# Patient Record
Sex: Female | Born: 1950
Health system: Southern US, Community
[De-identification: ages and names within clinical notes are randomized; demographics above are authoritative.]

## PROBLEM LIST (undated history)

## (undated) DIAGNOSIS — E785 Hyperlipidemia, unspecified: Secondary | ICD-10-CM

## (undated) HISTORY — DX: Hyperlipidemia, unspecified: E78.5

---

## 2000-11-14 HISTORY — PX: ABDOMINAL HYSTERECTOMY: SHX81

## 2001-07-15 LAB — CONVERTED CEMR LAB

## 2008-07-31 ENCOUNTER — Ambulatory Visit: Payer: Self-pay | Admitting: Internal Medicine

## 2008-07-31 LAB — CONVERTED CEMR LAB
Basophils Absolute: 0.1 10*3/uL (ref 0.0–0.1)
CO2: 28 meq/L (ref 19–32)
Chloride: 102 meq/L (ref 96–112)
Cholesterol: 241 mg/dL (ref 0–200)
Direct LDL: 219.9 mg/dL
GFR calc non Af Amer: 135 mL/min
HDL: 45.1 mg/dL (ref >39.0)
Lymphocytes Relative: 19.8 % (ref 12.0–46.0)
MCHC: 35 g/dL (ref 30.0–36.0)
Neutrophils Relative %: 73.5 % (ref 43.0–77.0)
Platelets: 230 10*3/uL (ref 150–400)
RDW: 11.7 % (ref 11.5–14.6)
Sodium: 139 meq/L (ref 135–145)
TSH: 0.78 microintl units/mL (ref 0.35–5.50)
Triglycerides: 78 mg/dL (ref 0–149)
VLDL: 16 mg/dL (ref 0–40)

## 2008-08-01 ENCOUNTER — Encounter: Payer: Self-pay | Admitting: Internal Medicine

## 2008-08-25 ENCOUNTER — Ambulatory Visit: Payer: Self-pay | Admitting: Internal Medicine

## 2008-08-25 DIAGNOSIS — E785 Hyperlipidemia, unspecified: Secondary | ICD-10-CM | POA: Insufficient documentation

## 2008-08-25 HISTORY — DX: Hyperlipidemia, unspecified: E78.5

## 2008-09-23 ENCOUNTER — Ambulatory Visit: Payer: Self-pay | Admitting: Internal Medicine

## 2008-09-23 LAB — CONVERTED CEMR LAB
ALT: 20 units/L (ref 0–35)
AST: 18 units/L (ref 0–37)
Albumin: 4.1 g/dL (ref 3.5–5.2)
Alkaline Phosphatase: 75 units/L (ref 39–117)
HDL: 43 mg/dL (ref >39.0)
Total Bilirubin: 0.8 mg/dL (ref 0.3–1.2)
Triglycerides: 74 mg/dL (ref 0–149)

## 2008-09-24 ENCOUNTER — Encounter: Payer: Self-pay | Admitting: Internal Medicine

## 2008-10-03 ENCOUNTER — Ambulatory Visit: Payer: Self-pay | Admitting: Internal Medicine

## 2008-10-03 ENCOUNTER — Encounter: Payer: Self-pay | Admitting: Internal Medicine

## 2008-10-06 ENCOUNTER — Encounter: Payer: Self-pay | Admitting: Internal Medicine

## 2009-08-23 ENCOUNTER — Telehealth: Payer: Self-pay | Admitting: Family Medicine

## 2010-03-15 ENCOUNTER — Ambulatory Visit: Payer: Self-pay | Admitting: Internal Medicine

## 2010-03-15 LAB — CONVERTED CEMR LAB
AST: 20 units/L (ref 0–37)
Alkaline Phosphatase: 59 units/L (ref 39–117)
Basophils Absolute: 0 10*3/uL (ref 0.0–0.1)
Bilirubin Urine: NEGATIVE
Bilirubin, Direct: 0.1 mg/dL (ref 0.0–0.3)
Calcium: 9.2 mg/dL (ref 8.4–10.5)
Creatinine, Ser: 0.5 mg/dL (ref 0.4–1.2)
Eosinophils Absolute: 0 10*3/uL (ref 0.0–0.7)
GFR calc non Af Amer: 133.56 mL/min (ref >60)
Glucose, Bld: 92 mg/dL (ref 70–99)
HDL: 51.8 mg/dL (ref >39.00)
Hemoglobin: 14.4 g/dL (ref 12.0–15.0)
Ketones, ur: NEGATIVE mg/dL
LDL Cholesterol: 128 mg/dL — ABNORMAL HIGH (ref 0–99)
Lymphocytes Relative: 23.3 % (ref 12.0–46.0)
MCHC: 34.8 g/dL (ref 30.0–36.0)
Monocytes Relative: 5.6 % (ref 3.0–12.0)
Neutro Abs: 4.4 10*3/uL (ref 1.4–7.7)
Neutrophils Relative %: 69.8 % (ref 43.0–77.0)
Nitrite: NEGATIVE
Platelets: 202 10*3/uL (ref 150.0–400.0)
RDW: 12.4 % (ref 11.5–14.6)
Sodium: 141 meq/L (ref 135–145)
Total Bilirubin: 0.5 mg/dL (ref 0.3–1.2)
Total CHOL/HDL Ratio: 4
Triglycerides: 59 mg/dL (ref 0.0–149.0)
VLDL: 11.8 mg/dL (ref 0.0–40.0)
pH: 6.5 (ref 5.0–8.0)

## 2010-03-22 ENCOUNTER — Ambulatory Visit: Payer: Self-pay | Admitting: Internal Medicine

## 2010-09-06 ENCOUNTER — Ambulatory Visit: Payer: Self-pay | Admitting: *Deleted

## 2010-12-14 NOTE — Assessment & Plan Note (Signed)
Summary: FLU SHOT/EVM  Nurse Visit   Allergies: No Known Drug Allergies  Immunizations Administered:  Influenza Vaccine:    Vaccine Type: FLULAVAL    Site: left deltoid    Mfr: GlaxoSmithKline    Dose: 0.25 ml    Route: IM    Given by: Levonne Spiller EMT-P    Exp. Date: 05/14/2011    Lot #: [id number]    VIS given: 06/08/10 version given September 06, 2010.   Immunizations Administered:  Influenza Vaccine:    Vaccine Type: FLULAVAL    Site: left deltoid    Mfr: GlaxoSmithKline    Dose: 0.25 ml    Route: IM    Given by: Levonne Spiller EMT-P    Exp. Date: 05/14/2011    Lot #: [id number]    VIS given: 06/08/10 version given September 06, 2010.  Flu Vaccine Consent Questions:    Do you have a history of severe allergic reactions to this vaccine? no    Any prior history of allergic reactions to egg and/or gelatin? no    Do you have a sensitivity to the preservative Thimersol? no    Do you have a past history of Guillan-Barre Syndrome? no    Do you currently have an acute febrile illness? no    Have you ever had a severe reaction to latex? no    Vaccine information given and explained to patient? yes    Are you currently pregnant? no

## 2010-12-14 NOTE — Assessment & Plan Note (Signed)
Summary: PHYSICAL--STC   Vital Signs:  Patient profile:   60 year old female Height:      60 inches Weight:      125 pounds BMI:     24.50 O2 Sat:      96 % on Room air Temp:     98.3 degrees F oral Pulse rate:   74 / minute BP sitting:   136 / 92  (left arm) Cuff size:   regular  Vitals Entered By: Bill Salinas CMA (Mar 22, 2010 1:33 PM)  O2 Flow:  Room air CC: pt here for cpx, she states that her tetanus is due and she has never had a zostavax/ ab  Vision Screening:      Vision Comments: pt's last eye exam was 2 years ago with normal exam. PT unsure of doctor but has them done at [facility]  Vision Entered By: Bill Salinas CMA (Mar 22, 2010 1:36 PM)   Primary Care Provider:  Jacques Navy MD  CC:  pt here for cpx and she states that her tetanus is due and she has never had a zostavax/ ab.  History of Present Illness: Patient presents today for a general examination. She has no complaints at today's visit except "hot flashes" which have been present for some time.  Current Medications (verified): 1)  Lovastatin 20 Mg Tabs (Lovastatin) .Marland Kitchen.. 1 By Mouth Qpm For Choloesterol  Allergies (verified): No Known Drug Allergies  Past History:  Past Medical History: Last updated: 08-29-2008 UCD No medical problems except h/o elevated BP readings without diagnosis of HTN  Past Surgical History: Last updated: 08-29-2008 Hysterectomy '02 - secondary to fibroids    G2P2- NSVD  Family History: Last updated: 08-29-08 father- deceased 23-Apr-2066: CAD/MI mother- deceased @ 60: pneumonia, (?)PE Sister- breast cancer Neg - colon cancer, DM brother - prostate cancer, CVA  Social History: Last updated: 29-Aug-2008 HSG/GED Married Apr 23, 2066 1 son - April 23, 2066, 1 daugher - 24-Apr-1979; e3 grand-daughters work - Medical laboratory scientific officer; had worked in Designer, fashion/clothing. marriage in good health.  Review of Systems  The patient denies anorexia, fever, weight loss, weight gain, vision loss, decreased  hearing, hoarseness, chest pain, syncope, dyspnea on exertion, peripheral edema, prolonged cough, headaches, hemoptysis, abdominal pain, melena, hematochezia, severe indigestion/heartburn, hematuria, incontinence, genital sores, muscle weakness, suspicious skin lesions, difficulty walking, unusual weight change, abnormal bleeding, enlarged lymph nodes, and breast masses.    Physical Exam  General:  WNWD white female in no distress. Head:  Normocephalic and atraumatic without obvious abnormalities. No apparent alopecia or balding. Eyes:  No corneal or conjunctival inflammation noted. EOMI. Perrla. Funduscopic exam benign, without hemorrhages, exudates or papilledema. Vision grossly normal. Ears:  R ear normal and L ear normal.   Nose:  no external deformity and no external erythema.   Mouth:  Oral mucosa and oropharynx without lesions or exudates.  Teeth in good repair. Neck:  supple, full ROM, no thyromegaly, and no carotid bruits.   Chest Wall:  no deformities, no tenderness, and no mass.   Breasts:  No mass, nodules, thickening, tenderness, bulging, retraction, inflamation, nipple discharge or skin changes noted.   Lungs:  Normal respiratory effort, chest expands symmetrically. Lungs are clear to auscultation, no crackles or wheezes. Heart:  Normal rate and regular rhythm. S1 and S2 normal without gallop, murmur, click, rub or other extra sounds. Abdomen:  soft, non-tender, normal bowel sounds, no distention, no guarding, no rigidity, no inguinal hernia, and no hepatomegaly.   Genitalia:  deferred to hysterectomy Msk:  normal ROM, no joint tenderness, no joint swelling, no joint warmth, no joint instability, and no crepitation.   Pulses:  2+ radial and DP pulses Extremities:  No clubbing, cyanosis, edema, or deformity noted with normal full range of motion of all joints.   Neurologic:  No cranial nerve deficits noted. Station and gait are normal. Plantar reflexes are down-going bilaterally.  DTRs are symmetrical throughout. Sensory, motor and coordinative functions appear intact. Skin:  turgor normal, color normal, no suspicious lesions, no purpura, and no edema.  small vascular red lesions that blanche on both legs Cervical Nodes:  no anterior cervical adenopathy and no posterior cervical adenopathy.   Axillary Nodes:  no R axillary adenopathy and no L axillary adenopathy.   Psych:  Oriented X3, memory intact for recent and remote, normally interactive, good eye contact, and not anxious appearing.     Impression & Recommendations:  Problem # 1:  HYPERLIPIDEMIA, SEVERE (ICD-272.4) LDL 128 - at goal of 130 or less  Plan - continue present meds  Her updated medication list for this problem includes:    Lovastatin 20 Mg Tabs (Lovastatin) .Marland Kitchen... 1 by mouth qpm for choloesterol  Problem # 2:  Preventive Health Care (ICD-V70.0) Normal history and normal exam. Labs are excellent. Current with mammography. Colonoscopy '09. Patient is due for pneumonia vaccine and tetnus booster and shingles vaccine. Will address at next visit or at her convenience.  IN summary - a ver nice woman who is medically stable at this time.   Complete Medication List: 1)  Lovastatin 20 Mg Tabs (Lovastatin) .Marland Kitchen.. 1 by mouth qpm for choloesterol   Patient: Meredith Holden Note: All result statuses are Final unless otherwise noted.  Tests: (1) BMP (METABOL)   Sodium                    141 mEq/L                   135-145   Potassium                 4.5 mEq/L                   3.5-5.1   Chloride                  104 mEq/L                   96-112   Carbon Dioxide            32 mEq/L                    19-32   Glucose                   92 mg/dL                    04-54   BUN                       14 mg/dL                    0-98   Creatinine                0.5 mg/dL                   1.1-9.1   Calcium  9.2 mg/dL                   5.6-21.3   GFR                       133.56 mL/min                >60  Tests: (2) CBC Platelet w/Diff (CBCD)   White Cell Count          6.3 K/uL                    4.5-10.5   Red Cell Count            4.35 Mil/uL                 3.87-5.11   Hemoglobin                14.4 g/dL                   08.6-57.8   Hematocrit                41.4 %                      36.0-46.0   MCV                       95.2 fl                     78.0-100.0   MCHC                      34.8 g/dL                   46.9-62.9   RDW                       12.4 %                      11.5-14.6   Platelet Count            202.0 K/uL                  150.0-400.0   Neutrophil %              69.8 %                      43.0-77.0   Lymphocyte %              23.3 %                      12.0-46.0   Monocyte %                5.6 %                       3.0-12.0   Eosinophils%              0.7 %                       0.0-5.0   Basophils %               0.6 %  0.0-3.0   Neutrophill Absolute      4.4 K/uL                    1.4-7.7   Lymphocyte Absolute       1.5 K/uL                    0.7-4.0   Monocyte Absolute         0.4 K/uL                    0.1-1.0  Eosinophils, Absolute                             0.0 K/uL                    0.0-0.7   Basophils Absolute        0.0 K/uL                    0.0-0.1  Tests: (3) Hepatic/Liver Function Panel (HEPATIC)   Total Bilirubin           0.5 mg/dL                   4.7-8.2   Direct Bilirubin          0.1 mg/dL                   9.5-6.2   Alkaline Phosphatase      59 U/L                      39-117   AST                       20 U/L                      0-37   ALT                       18 U/L                      0-35   Total Protein             6.3 g/dL                    1.3-0.8   Albumin                   4.0 g/dL                    6.5-7.8  Tests: (4) TSH (TSH)   FastTSH                   1.11 uIU/mL                 0.35-5.50  Tests: (5) Lipid Panel (LIPID)   Cholesterol               192 mg/dL                    4-696     ATP III Classification            Desirable:  < 200 mg/dL  Borderline High:  200 - 239 mg/dL               High:  > = 240 mg/dL   Triglycerides             59.0 mg/dL                  1.6-109.6     Normal:  <150 mg/dL     Borderline High:  045 - 199 mg/dL   HDL                       40.98 mg/dL                 >11.91   VLDL Cholesterol          11.8 mg/dL                  4.7-82.9   LDL Cholesterol      [H]  562 mg/dL                   1-30  CHO/HDL Ratio:  CHD Risk                             4                    Men          Women     1/2 Average Risk     3.4          3.3     Average Risk          5.0          4.4     2X Average Risk          9.6          7.1     3X Average Risk          15.0          11.0                           Tests: (6) UDip w/Micro (URINE)   Color                     Yellow       RANGE:  Yellow;Lt. Yellow   Clarity                   CLEAR                       Clear   Specific Gravity          1.010                       1.000 - 1.030   Urine Ph                  6.5                         5.0-8.0   Protein                   NEGATIVE                    Negative   Urine Glucose  NEGATIVE                    Negative   Ketones                   NEGATIVE                    Negative   Urine Bilirubin           NEGATIVE                    Negative   Blood                     TRACE-INTACT                Negative   Urobilinogen              0.2                         0.0 - 1.0   Leukocyte Esterace        TRACE                       Negative   Nitrite                   NEGATIVE                    Negative   Urine WBC                 0-2/hpf                     0-2/hpf   Urine RBC                 0-2/hpf                     0-2/hpf   Urine Epith               Rare(0-4/hpf)               Rare(0-4/hpf)    Immunization History:  Influenza Immunization History:    Influenza:  historical (08/15/2009)

## 2011-05-28 ENCOUNTER — Other Ambulatory Visit: Payer: Self-pay | Admitting: Internal Medicine

## 2011-08-19 ENCOUNTER — Encounter: Payer: Self-pay | Admitting: Internal Medicine

## 2011-09-22 ENCOUNTER — Encounter: Payer: Self-pay | Admitting: Internal Medicine

## 2011-09-26 ENCOUNTER — Encounter: Payer: Self-pay | Admitting: Internal Medicine

## 2011-09-26 ENCOUNTER — Ambulatory Visit (INDEPENDENT_AMBULATORY_CARE_PROVIDER_SITE_OTHER): Payer: PRIVATE HEALTH INSURANCE | Admitting: Internal Medicine

## 2011-09-26 ENCOUNTER — Other Ambulatory Visit (INDEPENDENT_AMBULATORY_CARE_PROVIDER_SITE_OTHER): Payer: PRIVATE HEALTH INSURANCE

## 2011-09-26 VITALS — BP 132/82 | HR 70 | Temp 97.6°F | Wt 131.0 lb

## 2011-09-26 DIAGNOSIS — Z23 Encounter for immunization: Secondary | ICD-10-CM

## 2011-09-26 DIAGNOSIS — E785 Hyperlipidemia, unspecified: Secondary | ICD-10-CM

## 2011-09-26 DIAGNOSIS — Z136 Encounter for screening for cardiovascular disorders: Secondary | ICD-10-CM

## 2011-09-26 DIAGNOSIS — Z0000 Encounter for general adult medical examination without abnormal findings: Secondary | ICD-10-CM

## 2011-09-26 MED ORDER — TETANUS-DIPHTH-ACELL PERTUSSIS 5-2.5-18.5 LF-MCG/0.5 IM SUSP: 0.5000 mL | Freq: Once | INTRAMUSCULAR | Status: DC

## 2011-09-26 MED ORDER — LOVASTATIN 20 MG PO TABS: 20.0000 mg | ORAL_TABLET | Freq: Every day | ORAL | Status: DC

## 2011-09-26 NOTE — Assessment & Plan Note (Addendum)
Patient tolerating medication without side effects.   Plan- routine lab with recommendations to follow. Rx sent in.  Addendum - LDL 123 is better than goal of 130 or less. No changes recommended.

## 2011-09-26 NOTE — Progress Notes (Signed)
Subjective:    Patient ID: Meredith Holden, female    DOB: Oct 21, 1951, 60 y.o.   MRN: [id number]  HPI Meredith Holden presents today for an annul exam. In the interval no major illness, no surgery and no injury.  Past Medical History  Diagnosis Date  . HYPERLIPIDEMIA, SEVERE 08/25/2008   Past Surgical History  Procedure Date  . Abdominal hysterectomy 2002    secondary to fibroids   Family History  Problem Relation Age of Onset  . Coronary artery disease Father   . Heart disease Father     sudden death from MI  . Hypertension Mother   . Hypertension Sister   . Cancer Sister     uterine cancer; 20 years later breast cancer  . Kidney disease Sister   . Heart disease Brother     s/p SBE, bioprothetic valve Mitral?   History   Social History  . Marital Status: Married    Spouse Name: N/A    Number of Children: 2  . Years of Education: 12   Occupational History  . vet assistant    Social History Main Topics  . Smoking status: Never Smoker   . Smokeless tobacco: Never Used  . Alcohol Use: No  . Drug Use: No  . Sexually Active: Not Currently   Other Topics Concern  . Not on file   Social History Narrative   HSG/GED. Married "23. 1 son-"67, 1 daughter "50; 3 grand-daughters. Film/video editor; had work in [employer]. Marriage is good       Review of Systems Constitutional:  Negative for fever, chills, activity change and unexpected weight change.  HEENT:  Negative for hearing loss, ear pain, congestion, neck stiffness and postnasal drip. Negative for sore throat or swallowing problems. Negative for dental complaints.   Eyes: Negative for vision loss or change in visual acuity.  Respiratory: Negative for chest tightness and wheezing. Negative for DOE.   Cardiovascular: Negative for chest pain or palpitations. No decreased exercise tolerance Gastrointestinal: No change in bowel habit. No bloating or gas. No reflux or indigestion Genitourinary: Negative for urgency,  frequency, flank pain and difficulty urinating.  Musculoskeletal: Negative for myalgias, back pain, arthralgias and gait problem.  Neurological: Negative for dizziness, tremors, weakness and headaches.  Hematological: Negative for adenopathy.  Psychiatric/Behavioral: Negative for behavioral problems and dysphoric mood.       Objective:   Physical Exam Vitals reviewed - vitals stable. Gen'l: well nourished, well developed white woman in no distress HEENT - Mulat/AT, EACs/TMs normal, oropharynx with native dentition in good condition, no buccal or palatal lesions, posterior pharynx clear, mucous membranes moist. C&S clear, PERRLA, fundi - normal Neck - supple, no thyromegaly Nodes- negative submental, cervical, supraclavicular regions Chest - no deformity, no CVAT Lungs - cleat without rales, wheezes. No increased work of breathing Breast - skin normal, nipples w/o discharge, no fixed mass or lesions, no axillary adenopathy Cardiovascular - regular rate and rhythm, quiet precordium, no murmurs, rubs or gallops, 2+ radial, DP and PT pulses Abdomen - BS+ x 4, no HSM, no guarding or rebound or tenderness Pelvic - deferred  Rectal - deferred  Extremities - no clubbing, cyanosis, edema or deformity.  Neuro - A&O x 3, CN II-XII normal, motor strength normal and equal, DTRs 2+ and symmetrical biceps, radial, and patellar tendons. Cerebellar - no tremor, no rigidity, fluid movement and normal gait. Derm - Head, neck, back, abdomen and extremities without suspicious lesions  Lab Results  Component Value Date  WBC 6.3 03/15/2010   HGB 14.4 03/15/2010   HCT 41.4 03/15/2010   PLT 202.0 03/15/2010   GLUCOSE 78 09/26/2011   CHOL 194 09/26/2011   TRIG 60.0 09/26/2011   HDL 58.60 09/26/2011   LDLDIRECT 219.9 07/31/2008   LDLCALC 123* 09/26/2011   ALT 16 09/26/2011   ALT 16 09/26/2011   AST 19 09/26/2011   AST 19 09/26/2011   NA 141 09/26/2011   K 4.9 09/26/2011   CL 106 09/26/2011   CREATININE 0.5  09/26/2011   BUN 14 09/26/2011   CO2 29 09/26/2011   TSH 1.11 03/15/2010              Assessment & Plan:

## 2011-09-26 NOTE — Assessment & Plan Note (Signed)
Interval history is benign. Physical exam is normal. Labs- Lipid, liver and Bmet:    Current for colorectal cancer screening with last study Nov '09. Immunizations: Tdap and Flu today. Last mammogram Sept 17th , '12 - normal. 12 lead EKG w/o signs of ischemia or injury.  In summary - a delightful woman who is medically stable and doing well.

## 2011-09-27 LAB — COMPREHENSIVE METABOLIC PANEL
ALT: 16 U/L (ref 0–35)
AST: 19 U/L (ref 0–37)
Albumin: 4.1 g/dL (ref 3.5–5.2)
CO2: 29 mEq/L (ref 19–32)
Calcium: 9.2 mg/dL (ref 8.4–10.5)
Chloride: 106 mEq/L (ref 96–112)
Creatinine, Ser: 0.5 mg/dL (ref 0.4–1.2)
GFR: 121.59 mL/min (ref >60.00)
Potassium: 4.9 mEq/L (ref 3.5–5.1)
Sodium: 141 mEq/L (ref 135–145)
Total Protein: 6.9 g/dL (ref 6.0–8.3)

## 2011-09-27 LAB — LIPID PANEL
HDL: 58.6 mg/dL (ref >39.00)
LDL Cholesterol: 123 mg/dL — ABNORMAL HIGH (ref 0–99)
Total CHOL/HDL Ratio: 3
Triglycerides: 60 mg/dL (ref 0.0–149.0)

## 2011-09-27 LAB — HEPATIC FUNCTION PANEL
Albumin: 4.1 g/dL (ref 3.5–5.2)
Total Protein: 6.9 g/dL (ref 6.0–8.3)

## 2011-10-01 ENCOUNTER — Encounter: Payer: Self-pay | Admitting: Internal Medicine

## 2013-08-05 ENCOUNTER — Encounter: Payer: Self-pay | Admitting: Internal Medicine

## 2014-04-12 ENCOUNTER — Encounter: Payer: Self-pay | Admitting: Internal Medicine

## 2014-08-04 ENCOUNTER — Other Ambulatory Visit (INDEPENDENT_AMBULATORY_CARE_PROVIDER_SITE_OTHER): Payer: No Typology Code available for payment source

## 2014-08-04 ENCOUNTER — Ambulatory Visit (INDEPENDENT_AMBULATORY_CARE_PROVIDER_SITE_OTHER): Payer: No Typology Code available for payment source | Admitting: Internal Medicine

## 2014-08-04 ENCOUNTER — Encounter: Payer: Self-pay | Admitting: Internal Medicine

## 2014-08-04 VITALS — BP 122/82 | HR 75 | Temp 98.3°F | Resp 16 | Ht 59.0 in | Wt 121.0 lb

## 2014-08-04 DIAGNOSIS — B8809 Other acariasis: Secondary | ICD-10-CM | POA: Diagnosis not present

## 2014-08-04 DIAGNOSIS — Z23 Encounter for immunization: Secondary | ICD-10-CM | POA: Diagnosis not present

## 2014-08-04 DIAGNOSIS — B880 Other acariasis: Secondary | ICD-10-CM | POA: Diagnosis not present

## 2014-08-04 DIAGNOSIS — E785 Hyperlipidemia, unspecified: Secondary | ICD-10-CM | POA: Diagnosis not present

## 2014-08-04 DIAGNOSIS — Z0000 Encounter for general adult medical examination without abnormal findings: Secondary | ICD-10-CM | POA: Diagnosis not present

## 2014-08-04 LAB — BASIC METABOLIC PANEL
BUN: 14 mg/dL (ref 6–23)
CALCIUM: 9.7 mg/dL (ref 8.4–10.5)
CO2: 33 mEq/L — ABNORMAL HIGH (ref 19–32)
Chloride: 105 mEq/L (ref 96–112)
Creatinine, Ser: 0.6 mg/dL (ref 0.4–1.2)
GFR: 113.2 mL/min (ref >60.00)
GLUCOSE: 110 mg/dL — AB (ref 70–99)
Potassium: 4.9 mEq/L (ref 3.5–5.1)
Sodium: 141 mEq/L (ref 135–145)

## 2014-08-04 LAB — LIPID PANEL
Cholesterol: 198 mg/dL (ref 0–200)
HDL: 60.6 mg/dL (ref >39.00)
LDL Cholesterol: 124 mg/dL — ABNORMAL HIGH (ref 0–99)
NONHDL: 137.4
Total CHOL/HDL Ratio: 3
Triglycerides: 67 mg/dL (ref 0.0–149.0)
VLDL: 13.4 mg/dL (ref 0.0–40.0)

## 2014-08-04 MED ORDER — ZOSTER VACCINE LIVE 19400 UNT/0.65ML ~~LOC~~ SOLR: 0.6500 mL | Freq: Once | SUBCUTANEOUS | Status: DC

## 2014-08-04 NOTE — Patient Instructions (Signed)
We will give you a prescription for the shingles shot. Call your insurance company and ask them how much it will be as usually medicare does not pay for this shot very well.   An over the counter water based lubricant is safe and effective. If this does not work well for you then we can send in an estrogen cream for you that should help you to have more natural moisture.   We will check your blood work today and call you with the results.  Come back in about 1 year for a check up. Call us if you are feeling sick or have questions sooner.  Chigger Bites:  Many home remedies for chigger bites are based upon the incorrect belief that chiggers burrow into and remain in the skin. Nail polish, alcohol, and bleach have been applied to the bites to attempt to get rid of the chiggers by "suffocating" or killing the chiggers. But because the chiggers are not present in the skin, these methods are not effective. Home remedies to help relieve the itching associated with chigger bites may help some people. These can include Taking a cool shower or applying cool compresses  Sitting in a cool bath  Using bath products that contain colloidal oatmeal Using benadryl cream over the counter

## 2014-08-04 NOTE — Assessment & Plan Note (Addendum)
Advised usage of OTC water based lubricants for her vaginal dryness with intercourse. Given rx for shingles shot. Flu shot given at today's visit. She will return in 1 year. Reminded about mammogram. Check lipid panel and BMP. In good health.

## 2014-08-04 NOTE — Assessment & Plan Note (Signed)
Talked with her about proper coverage when in outdoors, cool baths, benadryl cream, no itching to the sites. Advised to wash all bedding if she continues to get bites without new outdoor exposure.

## 2014-08-04 NOTE — Progress Notes (Signed)
Pre visit review using our clinic review tool, if applicable. No additional management support is needed unless otherwise documented below in the visit note. 

## 2014-08-04 NOTE — Progress Notes (Signed)
   Subjective:    Patient ID: Meredith Holden, female    DOB: 04-30-1951, 63 y.o.   MRN: [id number]  HPI The patient is a 63 YO female who is coming in today to establish care. She has PMH of hyperlipidemia. She is a lifelong non-smoker. She does have a couple bites on her lower ankle and top of foot that she wants looked at. She does have a trailer which stays parked at a park in [place] by [place] and thinks this is where she got bit. She has not noticed a rash around them and they do not hurt. They do not itch much either. She also wants to know what she can do for lubrication with sex. Since her hysterectomy she has noticed she is more dry. She denies chest pains, SOB, abdominal pain, diarrhea, constipation. She denies balance problems or falls. She denies change in her vision or hearing. Her last mammogram was about 1-2 years ago and she will go when she gets on medicare in November with her birthday. 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   Review of Systems  Constitutional: Negative for activity change, appetite change and fatigue.  HENT: Negative.   Eyes: Negative.   Respiratory: Negative for cough, chest tightness, shortness of breath and wheezing.   Cardiovascular: Negative for chest pain, palpitations and leg swelling.  Gastrointestinal: Negative for abdominal pain, diarrhea, constipation and abdominal distention.  Genitourinary: Negative.   Musculoskeletal: Negative for arthralgias, gait problem and myalgias.  Skin: Positive for wound.  Neurological: Negative for dizziness, weakness, light-headedness and headaches.      Objective:   Physical Exam  Vitals reviewed. Constitutional: She appears well-developed and well-nourished. No distress.  HENT:  Head: Normocephalic and atraumatic.  Eyes: EOM are normal.  Neck: Normal range of motion.  Cardiovascular: Normal rate and regular rhythm.   No murmur heard. Pulmonary/Chest: Effort normal and breath sounds  normal. No respiratory distress. She has no wheezes. She has no rales.  Abdominal: Soft. Bowel sounds are normal. She exhibits no distension. There is no tenderness. There is no rebound.  Musculoskeletal: She exhibits no tenderness.  Neurological: Coordination normal.  Skin: Skin is warm and dry.  Several red bumps on her right ankle and dorsum of the foot. Appear to be chigger bites. No surrounding rash or erythema. No warmth or tenderness.   Filed Vitals:   08/04/14 0809  BP: 122/82  Pulse: 75  Temp: 98.3 F (36.8 C)  TempSrc: Oral  Resp: 16  Height:  (1.499 m)  Weight: 121 lb (54.885 kg)  SpO2: 98%      Assessment & Plan:  Flu shot given and rx for shingles shot.

## 2014-08-04 NOTE — Assessment & Plan Note (Signed)
Continue lovastatin, check lipid panel. 

## 2014-08-13 ENCOUNTER — Other Ambulatory Visit: Payer: Self-pay | Admitting: Geriatric Medicine

## 2014-08-13 MED ORDER — LOVASTATIN 20 MG PO TABS: 20.0000 mg | ORAL_TABLET | Freq: Every day | ORAL | Status: DC

## 2015-04-30 ENCOUNTER — Telehealth: Payer: Self-pay | Admitting: *Deleted

## 2015-04-30 NOTE — Telephone Encounter (Signed)
I called pt to see when her last mammogram was. Last one in [facility] was 08/01/11. No answer/Left mess for patient to call back.

## 2015-05-01 NOTE — Telephone Encounter (Signed)
Patient returned call and I referred her to [facility].

## 2015-09-07 DIAGNOSIS — H5213 Myopia, bilateral: Secondary | ICD-10-CM | POA: Diagnosis not present

## 2015-09-07 DIAGNOSIS — H25042 Posterior subcapsular polar age-related cataract, left eye: Secondary | ICD-10-CM | POA: Diagnosis not present

## 2015-09-07 DIAGNOSIS — H11423 Conjunctival edema, bilateral: Secondary | ICD-10-CM | POA: Diagnosis not present

## 2015-09-07 DIAGNOSIS — H18413 Arcus senilis, bilateral: Secondary | ICD-10-CM | POA: Diagnosis not present

## 2015-09-07 DIAGNOSIS — H2513 Age-related nuclear cataract, bilateral: Secondary | ICD-10-CM | POA: Diagnosis not present

## 2015-09-07 DIAGNOSIS — H353111 Nonexudative age-related macular degeneration, right eye, early dry stage: Secondary | ICD-10-CM | POA: Diagnosis not present

## 2015-09-07 DIAGNOSIS — H11153 Pinguecula, bilateral: Secondary | ICD-10-CM | POA: Diagnosis not present

## 2015-09-07 DIAGNOSIS — H524 Presbyopia: Secondary | ICD-10-CM | POA: Diagnosis not present

## 2015-09-07 DIAGNOSIS — H52223 Regular astigmatism, bilateral: Secondary | ICD-10-CM | POA: Diagnosis not present

## 2016-08-22 DIAGNOSIS — R69 Illness, unspecified: Secondary | ICD-10-CM | POA: Diagnosis not present

## 2017-06-04 DIAGNOSIS — J988 Other specified respiratory disorders: Secondary | ICD-10-CM | POA: Diagnosis not present

## 2017-06-05 ENCOUNTER — Encounter: Payer: Self-pay | Admitting: Nurse Practitioner

## 2017-06-05 ENCOUNTER — Ambulatory Visit (INDEPENDENT_AMBULATORY_CARE_PROVIDER_SITE_OTHER): Payer: Medicare HMO | Admitting: Nurse Practitioner

## 2017-06-05 ENCOUNTER — Other Ambulatory Visit (INDEPENDENT_AMBULATORY_CARE_PROVIDER_SITE_OTHER): Payer: Medicare HMO

## 2017-06-05 VITALS — BP 156/88 | HR 96 | Temp 98.6°F | Ht 59.0 in | Wt 129.0 lb

## 2017-06-05 DIAGNOSIS — E782 Mixed hyperlipidemia: Secondary | ICD-10-CM | POA: Diagnosis not present

## 2017-06-05 DIAGNOSIS — H1032 Unspecified acute conjunctivitis, left eye: Secondary | ICD-10-CM

## 2017-06-05 DIAGNOSIS — J0140 Acute pansinusitis, unspecified: Secondary | ICD-10-CM | POA: Diagnosis not present

## 2017-06-05 LAB — HEPATIC FUNCTION PANEL
ALT: 41 U/L — AB (ref 0–35)
AST: 30 U/L (ref 0–37)
Albumin: 3.8 g/dL (ref 3.5–5.2)
Alkaline Phosphatase: 139 U/L — ABNORMAL HIGH (ref 39–117)
BILIRUBIN DIRECT: 0.2 mg/dL (ref 0.0–0.3)
BILIRUBIN TOTAL: 0.6 mg/dL (ref 0.2–1.2)
Total Protein: 7.2 g/dL (ref 6.0–8.3)

## 2017-06-05 LAB — LIPID PANEL
CHOL/HDL RATIO: 4
Cholesterol: 201 mg/dL — ABNORMAL HIGH (ref 0–200)
HDL: 44.8 mg/dL (ref >39.00)
LDL CALC: 139 mg/dL — AB (ref 0–99)
NonHDL: 156.61
TRIGLYCERIDES: 90 mg/dL (ref 0.0–149.0)
VLDL: 18 mg/dL (ref 0.0–40.0)

## 2017-06-05 MED ORDER — METHYLPREDNISOLONE ACETATE 40 MG/ML IJ SUSP
40.0000 mg | Freq: Once | INTRAMUSCULAR | Status: AC
  Administered 2017-06-05: 40 mg via INTRAMUSCULAR

## 2017-06-05 MED ORDER — HYDROCODONE-HOMATROPINE 5-1.5 MG/5ML PO SYRP: 5.0000 mL | ORAL_SOLUTION | Freq: Every evening | ORAL | 0 refills | Status: DC | PRN

## 2017-06-05 MED ORDER — DM-GUAIFENESIN ER 30-600 MG PO TB12: 1.0000 | ORAL_TABLET | Freq: Two times a day (BID) | ORAL | 0 refills | Status: DC | PRN

## 2017-06-05 MED ORDER — SALINE SPRAY 0.65 % NA SOLN: 1.0000 | NASAL | 0 refills | Status: DC | PRN

## 2017-06-05 MED ORDER — AMOXICILLIN-POT CLAVULANATE 875-125 MG PO TABS: 1.0000 | ORAL_TABLET | Freq: Two times a day (BID) | ORAL | 0 refills | Status: DC

## 2017-06-05 NOTE — Progress Notes (Signed)
Subjective:  Patient ID: Meredith Holden, female    DOB: 10-30-1951  Age: 66 y.o. MRN: [id number]  CC: Cough (coughing up green mucus---going on for 1 wk/ went to urgent care Sunday--got cough med and nasal spray--not helping/ last ov 2015 med refill?)   Cough  This is a new problem. The current episode started 1 to 4 weeks ago. The problem has been gradually worsening. The problem occurs constantly. The cough is productive of purulent sputum. Associated symptoms include chills, headaches, myalgias, nasal congestion, postnasal drip, rhinorrhea and a sore throat. Pertinent negatives include no chest pain, ear congestion, ear pain, fever, shortness of breath, weight loss or wheezing. The symptoms are aggravated by lying down. She has tried OTC cough suppressant and prescription cough suppressant for the symptoms. The treatment provided no relief. Her past medical history is significant for bronchitis. There is no history of asthma.    Outpatient Medications Prior to Visit  Medication Sig Dispense Refill  . lovastatin (MEVACOR) 20 MG tablet Take 1 tablet (20 mg total) by mouth at bedtime. 90 tablet 3  . zoster vaccine live, PF, (ZOSTAVAX) 40981 UNT/0.65ML injection Inject 19,400 Units into the skin once. (Patient not taking: Reported on 06/05/2017) 1 each 0   No facility-administered medications prior to visit.     ROS See HPI  Objective:  BP (!) 156/88   Pulse 96   Temp 98.6 F (37 C)   Ht 4\' 11"  (1.499 m)   Wt 129 lb (58.5 kg)   SpO2 97%   BMI 26.05 kg/m   BP Readings from Last 3 Encounters:  06/05/17 (!) 156/88  08/04/14 122/82  09/26/11 132/82    Wt Readings from Last 3 Encounters:  06/05/17 129 lb (58.5 kg)  08/04/14 121 lb (54.9 kg)  09/26/11 131 lb (59.4 kg)    Physical Exam  Constitutional: She is oriented to person, place, and time.  HENT:  Right Ear: Tympanic membrane, external ear and ear canal normal.  Left Ear: Tympanic membrane, external ear and ear canal  normal.  Nose: Mucosal edema and rhinorrhea present. Right sinus exhibits maxillary sinus tenderness. Right sinus exhibits no frontal sinus tenderness. Left sinus exhibits maxillary sinus tenderness. Left sinus exhibits no frontal sinus tenderness.  Mouth/Throat: Uvula is midline. No trismus in the jaw. Posterior oropharyngeal erythema present. No oropharyngeal exudate.  Eyes: Pupils are equal, round, and reactive to light. EOM are normal. Right eye exhibits no chemosis, no discharge, no exudate and no hordeolum. No foreign body present in the right eye. Left eye exhibits chemosis, discharge and exudate. Left eye exhibits no hordeolum. No foreign body present in the left eye. Right conjunctiva is not injected. Right conjunctiva has no hemorrhage. Left conjunctiva is injected. Left conjunctiva has no hemorrhage.  Neck: Normal range of motion. Neck supple.  Cardiovascular: Normal rate and normal heart sounds.   Pulmonary/Chest: Effort normal and breath sounds normal.  Musculoskeletal: She exhibits no edema.  Lymphadenopathy:    She has cervical adenopathy.  Neurological: She is alert and oriented to person, place, and time.  Skin: Skin is warm and dry.  Vitals reviewed.   Lab Results  Component Value Date   WBC 6.3 03/15/2010   HGB 14.4 03/15/2010   HCT 41.4 03/15/2010   PLT 202.0 03/15/2010   GLUCOSE 110 (H) 08/04/2014   CHOL 201 (H) 06/05/2017   TRIG 90.0 06/05/2017   HDL 44.80 06/05/2017   LDLDIRECT 219.9 07/31/2008   LDLCALC 139 (H) 06/05/2017   ALT  41 (H) 06/05/2017   AST 30 06/05/2017   NA 141 08/04/2014   K 4.9 08/04/2014   CL 105 08/04/2014   CREATININE 0.6 08/04/2014   BUN 14 08/04/2014   CO2 33 (H) 08/04/2014   TSH 1.11 03/15/2010    No results found.  Assessment & Plan:   Meredith Holden was seen today for cough.  Diagnoses and all orders for this visit:  Mixed hyperlipidemia -     Lipid panel; Future -     Hepatic function panel; Future -     lovastatin (MEVACOR) 20 MG  tablet; Take 1 tablet (20 mg total) by mouth at bedtime.  Acute non-recurrent pansinusitis -     amoxicillin-clavulanate (AUGMENTIN) 875-125 MG tablet; Take 1 tablet by mouth 2 (two) times daily. -     dextromethorphan-guaiFENesin (MUCINEX DM) 30-600 MG 12hr tablet; Take 1 tablet by mouth 2 (two) times daily as needed for cough. -     sodium chloride (OCEAN) 0.65 % SOLN nasal spray; Place 1 spray into both nostrils as needed for congestion. -     HYDROcodone-homatropine (HYCODAN) 5-1.5 MG/5ML syrup; Take 5 mLs by mouth at bedtime as needed for cough. -     methylPREDNISolone acetate (DEPO-MEDROL) injection 40 mg; Inject 1 mL (40 mg total) into the muscle once.  Acute bacterial conjunctivitis of left eye -     amoxicillin-clavulanate (AUGMENTIN) 875-125 MG tablet; Take 1 tablet by mouth 2 (two) times daily. -     dextromethorphan-guaiFENesin (MUCINEX DM) 30-600 MG 12hr tablet; Take 1 tablet by mouth 2 (two) times daily as needed for cough. -     sodium chloride (OCEAN) 0.65 % SOLN nasal spray; Place 1 spray into both nostrils as needed for congestion. -     HYDROcodone-homatropine (HYCODAN) 5-1.5 MG/5ML syrup; Take 5 mLs by mouth at bedtime as needed for cough. -     methylPREDNISolone acetate (DEPO-MEDROL) injection 40 mg; Inject 1 mL (40 mg total) into the muscle once.   I am having Meredith Holden start on amoxicillin-clavulanate, dextromethorphan-guaiFENesin, sodium chloride, and HYDROcodone-homatropine. I am also having her maintain her zoster vaccine live (PF), ipratropium, and lovastatin. We administered methylPREDNISolone acetate.  Meds ordered this encounter  Medications  . ipratropium (ATROVENT) 0.03 % nasal spray  . amoxicillin-clavulanate (AUGMENTIN) 875-125 MG tablet    Sig: Take 1 tablet by mouth 2 (two) times daily.    Dispense:  14 tablet    Refill:  0    Order Specific Question:   Supervising Provider    Answer:   Tresa GarterPLOTNIKOV, ALEKSEI V [[id number]]  . dextromethorphan-guaiFENesin  (MUCINEX DM) 30-600 MG 12hr tablet    Sig: Take 1 tablet by mouth 2 (two) times daily as needed for cough.    Dispense:  14 tablet    Refill:  0    Order Specific Question:   Supervising Provider    Answer:   Tresa GarterPLOTNIKOV, ALEKSEI V [[id number]]  . sodium chloride (OCEAN) 0.65 % SOLN nasal spray    Sig: Place 1 spray into both nostrils as needed for congestion.    Dispense:  15 mL    Refill:  0    Order Specific Question:   Supervising Provider    Answer:   Tresa GarterPLOTNIKOV, ALEKSEI V [[id number]]  . HYDROcodone-homatropine (HYCODAN) 5-1.5 MG/5ML syrup    Sig: Take 5 mLs by mouth at bedtime as needed for cough.    Dispense:  120 mL    Refill:  0    Order Specific Question:  Supervising Provider    Answer:   Tresa Garter [[id number]]  . methylPREDNISolone acetate (DEPO-MEDROL) injection 40 mg  . lovastatin (MEVACOR) 20 MG tablet    Sig: Take 1 tablet (20 mg total) by mouth at bedtime.    Dispense:  90 tablet    Refill:  1    Order Specific Question:   Supervising Provider    Answer:   Tresa Garter [[id number]]    Follow-up: Return in about 6 months (around 12/06/2017) for hyperlipidemia with Dr. Okey Dupre.  Alysia Penna, NP

## 2017-06-05 NOTE — Patient Instructions (Addendum)
Go to basement for blood draw. Will like to review lab results prior to sending medication refill.  URI Instructions: Flonase and Afrin use: apply 1spray of afrin in each nare, wait , then apply 2sprays of flonase in each nare. Use both nasal spray consecutively x 3days, then flonase only for at least 14days.  Encourage adequate oral hydration.  Use over-the-counter  "cold" medicines  such as "Tylenol cold" , "Advil cold",  "Mucinex" or" Mucinex D"  for cough and congestion.  Avoid decongestants if you have high blood pressure. Use" Delsym" or" Robitussin" cough syrup varietis for cough.  You can use plain "Tylenol" or "Advi"l for fever, chills and achyness.

## 2017-06-06 MED ORDER — LOVASTATIN 20 MG PO TABS: 20.0000 mg | ORAL_TABLET | Freq: Every day | ORAL | 1 refills | Status: DC

## 2017-08-14 DIAGNOSIS — R69 Illness, unspecified: Secondary | ICD-10-CM | POA: Diagnosis not present

## 2017-11-27 ENCOUNTER — Ambulatory Visit: Payer: Medicare HMO | Admitting: Internal Medicine

## 2017-11-28 ENCOUNTER — Ambulatory Visit: Payer: Medicare HMO | Admitting: Internal Medicine

## 2017-12-04 ENCOUNTER — Encounter: Payer: Self-pay | Admitting: Internal Medicine

## 2017-12-04 ENCOUNTER — Ambulatory Visit (INDEPENDENT_AMBULATORY_CARE_PROVIDER_SITE_OTHER): Payer: Medicare HMO | Admitting: Internal Medicine

## 2017-12-04 VITALS — BP 122/74 | HR 77 | Temp 98.1°F | Ht 59.0 in | Wt 127.1 lb

## 2017-12-04 DIAGNOSIS — Z23 Encounter for immunization: Secondary | ICD-10-CM | POA: Diagnosis not present

## 2017-12-04 DIAGNOSIS — E2839 Other primary ovarian failure: Secondary | ICD-10-CM

## 2017-12-04 DIAGNOSIS — Z0000 Encounter for general adult medical examination without abnormal findings: Secondary | ICD-10-CM | POA: Diagnosis not present

## 2017-12-04 DIAGNOSIS — E782 Mixed hyperlipidemia: Secondary | ICD-10-CM

## 2017-12-04 NOTE — Patient Instructions (Signed)
We will order the mammogram and bone density test.   The colonoscopy is due in the fall and we will have the GI office call you when it is time.   Ask the pharmacy about the shingrix vaccine to prevent shingles.   Health Maintenance, Female Adopting a healthy lifestyle and getting preventive care can go a long way to promote health and wellness. Talk with your health care provider about what schedule of regular examinations is right for you. This is a good chance for you to check in with your provider about disease prevention and staying healthy. In between checkups, there are plenty of things you can do on your own. Experts have done a lot of research about which lifestyle changes and preventive measures are most likely to keep you healthy. Ask your health care provider for more information. Weight and diet Eat a healthy diet  Be sure to include plenty of vegetables, fruits, low-fat dairy products, and lean protein.  Do not eat a lot of foods high in solid fats, added sugars, or salt.  Get regular exercise. This is one of the most important things you can do for your health. ? Most adults should exercise for at least 150 minutes each week. The exercise should increase your heart rate and make you sweat (moderate-intensity exercise). ? Most adults should also do strengthening exercises at least twice a week. This is in addition to the moderate-intensity exercise.  Maintain a healthy weight  Body mass index (BMI) is a measurement that can be used to identify possible weight problems. It estimates body fat based on height and weight. Your health care provider can help determine your BMI and help you achieve or maintain a healthy weight.  For females 64 years of age and older: ? A BMI below 18.5 is considered underweight. ? A BMI of 18.5 to 24.9 is normal. ? A BMI of 25 to 29.9 is considered overweight. ? A BMI of 30 and above is considered obese.  Watch levels of cholesterol and blood  lipids  You should start having your blood tested for lipids and cholesterol at 69 years of age, then have this test every 5 years.  You may need to have your cholesterol levels checked more often if: ? Your lipid or cholesterol levels are high. ? You are older than 69 years of age. ? You are at high risk for heart disease.  Cancer screening Lung Cancer  Lung cancer screening is recommended for adults 34-17 years old who are at high risk for lung cancer because of a history of smoking.  A yearly low-dose CT scan of the lungs is recommended for people who: ? Currently smoke. ? Have quit within the past 15 years. ? Have at least a 30-pack-year history of smoking. A pack year is smoking an average of one pack of cigarettes a day for 1 year.  Yearly screening should continue until it has been 15 years since you quit.  Yearly screening should stop if you develop a health problem that would prevent you from having lung cancer treatment.  Breast Cancer  Practice breast self-awareness. This means understanding how your breasts normally appear and feel.  It also means doing regular breast self-exams. Let your health care provider know about any changes, no matter how small.  If you are in your 20s or 30s, you should have a clinical breast exam (CBE) by a health care provider every 1-3 years as part of a regular health exam.  If  you are 40 or older, have a CBE every year. Also consider having a breast X-ray (mammogram) every year.  If you have a family history of breast cancer, talk to your health care provider about genetic screening.  If you are at high risk for breast cancer, talk to your health care provider about having an MRI and a mammogram every year.  Breast cancer gene (BRCA) assessment is recommended for women who have family members with BRCA-related cancers. BRCA-related cancers include: ? Breast. ? Ovarian. ? Tubal. ? Peritoneal cancers.  Results of the assessment will  determine the need for genetic counseling and BRCA1 and BRCA2 testing.  Cervical Cancer Your health care provider may recommend that you be screened regularly for cancer of the pelvic organs (ovaries, uterus, and vagina). This screening involves a pelvic examination, including checking for microscopic changes to the surface of your cervix (Pap test). You may be encouraged to have this screening done every 3 years, beginning at age 55.  For women ages 34-65, health care providers may recommend pelvic exams and Pap testing every 3 years, or they may recommend the Pap and pelvic exam, combined with testing for human papilloma virus (HPV), every 5 years. Some types of HPV increase your risk of cervical cancer. Testing for HPV may also be done on women of any age with unclear Pap test results.  Other health care providers may not recommend any screening for nonpregnant women who are considered low risk for pelvic cancer and who do not have symptoms. Ask your health care provider if a screening pelvic exam is right for you.  If you have had past treatment for cervical cancer or a condition that could lead to cancer, you need Pap tests and screening for cancer for at least 20 years after your treatment. If Pap tests have been discontinued, your risk factors (such as having a new sexual partner) need to be reassessed to determine if screening should resume. Some women have medical problems that increase the chance of getting cervical cancer. In these cases, your health care provider may recommend more frequent screening and Pap tests.  Colorectal Cancer  This type of cancer can be detected and often prevented.  Routine colorectal cancer screening usually begins at 69 years of age and continues through 69 years of age.  Your health care provider may recommend screening at an earlier age if you have risk factors for colon cancer.  Your health care provider may also recommend using home test kits to check  for hidden blood in the stool.  A small camera at the end of a tube can be used to examine your colon directly (sigmoidoscopy or colonoscopy). This is done to check for the earliest forms of colorectal cancer.  Routine screening usually begins at age 58.  Direct examination of the colon should be repeated every 5-10 years through 69 years of age. However, you may need to be screened more often if early forms of precancerous polyps or small growths are found.  Skin Cancer  Check your skin from head to toe regularly.  Tell your health care provider about any new moles or changes in moles, especially if there is a change in a mole's shape or color.  Also tell your health care provider if you have a mole that is larger than the size of a pencil eraser.  Always use sunscreen. Apply sunscreen liberally and repeatedly throughout the day.  Protect yourself by wearing long sleeves, pants, a wide-brimmed hat, and sunglasses  whenever you are outside.  Heart disease, diabetes, and high blood pressure  High blood pressure causes heart disease and increases the risk of stroke. High blood pressure is more likely to develop in: ? People who have blood pressure in the high end of the normal range (130-139/85-89 mm Hg). ? People who are overweight or obese. ? People who are African American.  If you are 80-64 years of age, have your blood pressure checked every 3-5 years. If you are 52 years of age or older, have your blood pressure checked every year. You should have your blood pressure measured twice-once when you are at a hospital or clinic, and once when you are not at a hospital or clinic. Record the average of the two measurements. To check your blood pressure when you are not at a hospital or clinic, you can use: ? An automated blood pressure machine at a pharmacy. ? A home blood pressure monitor.  If you are between 58 years and 58 years old, ask your health care provider if you should take  aspirin to prevent strokes.  Have regular diabetes screenings. This involves taking a blood sample to check your fasting blood sugar level. ? If you are at a normal weight and have a low risk for diabetes, have this test once every three years after 69 years of age. ? If you are overweight and have a high risk for diabetes, consider being tested at a younger age or more often. Preventing infection Hepatitis B  If you have a higher risk for hepatitis B, you should be screened for this virus. You are considered at high risk for hepatitis B if: ? You were born in a country where hepatitis B is common. Ask your health care provider which countries are considered high risk. ? Your parents were born in a high-risk country, and you have not been immunized against hepatitis B (hepatitis B vaccine). ? You have HIV or AIDS. ? You use needles to inject street drugs. ? You live with someone who has hepatitis B. ? You have had sex with someone who has hepatitis B. ? You get hemodialysis treatment. ? You take certain medicines for conditions, including cancer, organ transplantation, and autoimmune conditions.  Hepatitis C  Blood testing is recommended for: ? Everyone born from [birth year] through 1967. ? Anyone with known risk factors for hepatitis C.  Sexually transmitted infections (STIs)  You should be screened for sexually transmitted infections (STIs) including gonorrhea and chlamydia if: ? You are sexually active and are younger than 69 years of age. ? You are older than 69 years of age and your health care provider tells you that you are at risk for this type of infection. ? Your sexual activity has changed since you were last screened and you are at an increased risk for chlamydia or gonorrhea. Ask your health care provider if you are at risk.  If you do not have HIV, but are at risk, it may be recommended that you take a prescription medicine daily to prevent HIV infection. This is called  pre-exposure prophylaxis (PrEP). You are considered at risk if: ? You are sexually active and do not regularly use condoms or know the HIV status of your partner(s). ? You take drugs by injection. ? You are sexually active with a partner who has HIV.  Talk with your health care provider about whether you are at high risk of being infected with HIV. If you choose to begin PrEP, you should  first be tested for HIV. You should then be tested every 3 months for as long as you are taking PrEP. Pregnancy  If you are premenopausal and you may become pregnant, ask your health care provider about preconception counseling.  If you may become pregnant, take 400 to 800 micrograms (mcg) of folic acid every day.  If you want to prevent pregnancy, talk to your health care provider about birth control (contraception). Osteoporosis and menopause  Osteoporosis is a disease in which the bones lose minerals and strength with aging. This can result in serious bone fractures. Your risk for osteoporosis can be identified using a bone density scan.  If you are 64 years of age or older, or if you are at risk for osteoporosis and fractures, ask your health care provider if you should be screened.  Ask your health care provider whether you should take a calcium or vitamin D supplement to lower your risk for osteoporosis.  Menopause may have certain physical symptoms and risks.  Hormone replacement therapy may reduce some of these symptoms and risks. Talk to your health care provider about whether hormone replacement therapy is right for you. Follow these instructions at home:  Schedule regular health, dental, and eye exams.  Stay current with your immunizations.  Do not use any tobacco products including cigarettes, chewing tobacco, or electronic cigarettes.  If you are pregnant, do not drink alcohol.  If you are breastfeeding, limit how much and how often you drink alcohol.  Limit alcohol intake to no more  than 1 drink per day for nonpregnant women. One drink equals 12 ounces of beer, 5 ounces of wine, or 1 ounces of hard liquor.  Do not use street drugs.  Do not share needles.  Ask your health care provider for help if you need support or information about quitting drugs.  Tell your health care provider if you often feel depressed.  Tell your health care provider if you have ever been abused or do not feel safe at home. This information is not intended to replace advice given to you by your health care provider. Make sure you discuss any questions you have with your health care provider. Document Released: 05/16/2011 Document Revised: 04/07/2016 Document Reviewed: 08/04/2015 Elsevier Interactive Patient Education  [employer].

## 2017-12-04 NOTE — Assessment & Plan Note (Signed)
Recent lipid panel at goal, continue lovastatin daily. LDL goal <130.

## 2017-12-04 NOTE — Assessment & Plan Note (Signed)
Pneumonia  23 given at visit to start pneumonia series. Flu and tetanus up to date. Mammogram and bone density ordered. Labs up to date. Aged out of pap smear. Needs colonoscopy later this year and GI referral placed. Counseled about sun safety and mole surveillance. Given 10 year screening recommendations.

## 2017-12-04 NOTE — Progress Notes (Signed)
   Subjective:    Patient ID: Meredith Holden, female    DOB: [birth year], 67 y.o.   MRN: [id number]  HPI Here for medicare wellness and physical, no new complaints. Please see A/P for status and treatment of chronic medical problems.   Diet: heart healthy Physical activity: sedentary Depression/mood screen: negative Hearing: intact to whispered voice Visual acuity: grossly normal, performs annual eye exam  ADLs: capable Fall risk: none Home safety: good Cognitive evaluation: intact to orientation, naming, recall and repetition EOL planning: adv directives discussed  I have personally reviewed and have noted 1. The patient's medical and social history - reviewed today no changes 2. Their use of alcohol, tobacco or illicit drugs 3. Their current medications and supplements 4. The patient's functional ability including ADL's, fall risks, home safety risks and hearing or visual impairment. 5. Diet and physical activities 6. Evidence for depression or mood disorders 7. Care team reviewed and updated (available in snapshot)  Review of Systems  Constitutional: Negative.   HENT: Negative.   Eyes: Negative.   Respiratory: Negative for cough, chest tightness and shortness of breath.   Cardiovascular: Negative for chest pain, palpitations and leg swelling.  Gastrointestinal: Negative for abdominal distention, abdominal pain, constipation, diarrhea, nausea and vomiting.  Musculoskeletal: Negative.   Skin: Negative.   Neurological: Negative.   Psychiatric/Behavioral: Negative.       Objective:   Physical Exam  Constitutional: She is oriented to person, place, and time. She appears well-developed and well-nourished.  HENT:  Head: Normocephalic and atraumatic.  Eyes: EOM are normal.  Neck: Normal range of motion.  Cardiovascular: Normal rate and regular rhythm.  Pulmonary/Chest: Effort normal and breath sounds normal. No respiratory distress. She has no wheezes. She has no rales.    Abdominal: Soft. Bowel sounds are normal. She exhibits no distension. There is no tenderness. There is no rebound.  Musculoskeletal: She exhibits no edema.  Neurological: She is alert and oriented to person, place, and time. Coordination normal.  Skin: Skin is warm and dry.  Psychiatric: She has a normal mood and affect.   Vitals:   12/04/17 0956  BP: 122/74  Pulse: 77  Temp: 98.1 F (36.7 C)  TempSrc: Oral  SpO2: 96%  Weight: 127 lb 1.3 oz (57.6 kg)  Height: 4\' 11"  (1.499 m)      Assessment & Plan:  Prevnar 23 given at visit

## 2017-12-06 ENCOUNTER — Other Ambulatory Visit: Payer: Self-pay

## 2017-12-06 DIAGNOSIS — E782 Mixed hyperlipidemia: Secondary | ICD-10-CM

## 2017-12-06 MED ORDER — LOVASTATIN 20 MG PO TABS: 20.0000 mg | ORAL_TABLET | Freq: Every day | ORAL | 1 refills | Status: DC

## 2018-06-04 ENCOUNTER — Other Ambulatory Visit: Payer: Self-pay | Admitting: Internal Medicine

## 2018-06-04 DIAGNOSIS — E782 Mixed hyperlipidemia: Secondary | ICD-10-CM

## 2018-08-17 DIAGNOSIS — R69 Illness, unspecified: Secondary | ICD-10-CM | POA: Diagnosis not present

## 2018-10-19 DIAGNOSIS — H11153 Pinguecula, bilateral: Secondary | ICD-10-CM | POA: Diagnosis not present

## 2018-10-19 DIAGNOSIS — H52223 Regular astigmatism, bilateral: Secondary | ICD-10-CM | POA: Diagnosis not present

## 2018-10-19 DIAGNOSIS — H25013 Cortical age-related cataract, bilateral: Secondary | ICD-10-CM | POA: Diagnosis not present

## 2018-10-19 DIAGNOSIS — H18413 Arcus senilis, bilateral: Secondary | ICD-10-CM | POA: Diagnosis not present

## 2018-10-19 DIAGNOSIS — H5213 Myopia, bilateral: Secondary | ICD-10-CM | POA: Diagnosis not present

## 2018-10-19 DIAGNOSIS — H1045 Other chronic allergic conjunctivitis: Secondary | ICD-10-CM | POA: Diagnosis not present

## 2018-10-19 DIAGNOSIS — H35371 Puckering of macula, right eye: Secondary | ICD-10-CM | POA: Diagnosis not present

## 2018-10-19 DIAGNOSIS — H524 Presbyopia: Secondary | ICD-10-CM | POA: Diagnosis not present

## 2018-10-19 DIAGNOSIS — H353111 Nonexudative age-related macular degeneration, right eye, early dry stage: Secondary | ICD-10-CM | POA: Diagnosis not present

## 2018-10-19 DIAGNOSIS — H2513 Age-related nuclear cataract, bilateral: Secondary | ICD-10-CM | POA: Diagnosis not present

## 2018-11-27 ENCOUNTER — Other Ambulatory Visit: Payer: Self-pay | Admitting: Internal Medicine

## 2018-11-27 DIAGNOSIS — E782 Mixed hyperlipidemia: Secondary | ICD-10-CM

## 2019-02-19 ENCOUNTER — Other Ambulatory Visit: Payer: Self-pay | Admitting: Internal Medicine

## 2019-02-19 DIAGNOSIS — E782 Mixed hyperlipidemia: Secondary | ICD-10-CM

## 2019-07-10 DIAGNOSIS — R69 Illness, unspecified: Secondary | ICD-10-CM | POA: Diagnosis not present

## 2019-08-02 DIAGNOSIS — R69 Illness, unspecified: Secondary | ICD-10-CM | POA: Diagnosis not present

## 2020-02-06 DIAGNOSIS — H2513 Age-related nuclear cataract, bilateral: Secondary | ICD-10-CM | POA: Diagnosis not present

## 2020-02-06 DIAGNOSIS — H04123 Dry eye syndrome of bilateral lacrimal glands: Secondary | ICD-10-CM | POA: Diagnosis not present

## 2020-02-06 DIAGNOSIS — Z0100 Encounter for examination of eyes and vision without abnormal findings: Secondary | ICD-10-CM | POA: Diagnosis not present

## 2020-02-06 DIAGNOSIS — H353131 Nonexudative age-related macular degeneration, bilateral, early dry stage: Secondary | ICD-10-CM | POA: Diagnosis not present

## 2020-08-17 DIAGNOSIS — R69 Illness, unspecified: Secondary | ICD-10-CM | POA: Diagnosis not present

## 2020-08-24 DIAGNOSIS — R69 Illness, unspecified: Secondary | ICD-10-CM | POA: Diagnosis not present

## 2020-09-01 DIAGNOSIS — R69 Illness, unspecified: Secondary | ICD-10-CM | POA: Diagnosis not present

## 2022-03-31 ENCOUNTER — Other Ambulatory Visit: Payer: Self-pay | Admitting: Internal Medicine

## 2022-03-31 ENCOUNTER — Telehealth: Payer: Self-pay

## 2022-03-31 DIAGNOSIS — Z1231 Encounter for screening mammogram for malignant neoplasm of breast: Secondary | ICD-10-CM

## 2022-03-31 NOTE — Telephone Encounter (Signed)
Patient came up on the [facility] list, last visit with Dr.Crawford was 12/04/17. I did reach out to her to see if she wanted to re-establish care and she states that she does but does not know when she will be able to come in. I did try to schedule her anyway but she refused. Patient states that she will have her mammogram done prior to calling our office to re-establish care.

## 2022-04-05 ENCOUNTER — Ambulatory Visit
Admission: RE | Admit: 2022-04-05 | Discharge: 2022-04-05 | Disposition: A | Discharge status: Home / Self Care | Payer: Medicare HMO | Source: Ambulatory Visit | Attending: Internal Medicine | Admitting: Internal Medicine

## 2022-04-05 DIAGNOSIS — Z1231 Encounter for screening mammogram for malignant neoplasm of breast: Secondary | ICD-10-CM

## 2022-06-27 ENCOUNTER — Telehealth: Payer: Self-pay | Admitting: Internal Medicine

## 2022-06-27 NOTE — Telephone Encounter (Signed)
LVM for pt to rtn my call to schedule AWV with [facility] call back # [phone] 

## 2022-07-04 ENCOUNTER — Other Ambulatory Visit: Payer: Self-pay | Admitting: Internal Medicine

## 2022-07-04 ENCOUNTER — Ambulatory Visit (INDEPENDENT_AMBULATORY_CARE_PROVIDER_SITE_OTHER): Payer: Medicare HMO | Admitting: Internal Medicine

## 2022-07-04 ENCOUNTER — Encounter: Payer: Self-pay | Admitting: Internal Medicine

## 2022-07-04 VITALS — BP 120/68 | HR 69 | Temp 97.9°F | Ht 59.0 in | Wt 127.0 lb

## 2022-07-04 DIAGNOSIS — Z0000 Encounter for general adult medical examination without abnormal findings: Secondary | ICD-10-CM | POA: Diagnosis not present

## 2022-07-04 DIAGNOSIS — Z23 Encounter for immunization: Secondary | ICD-10-CM

## 2022-07-04 DIAGNOSIS — E782 Mixed hyperlipidemia: Secondary | ICD-10-CM | POA: Diagnosis not present

## 2022-07-04 DIAGNOSIS — Z1211 Encounter for screening for malignant neoplasm of colon: Secondary | ICD-10-CM

## 2022-07-04 LAB — COMPREHENSIVE METABOLIC PANEL
ALT: 10 U/L (ref 0–35)
AST: 16 U/L (ref 0–37)
Albumin: 4.3 g/dL (ref 3.5–5.2)
Alkaline Phosphatase: 77 U/L (ref 39–117)
BUN: 16 mg/dL (ref 6–23)
CO2: 28 mEq/L (ref 19–32)
Calcium: 9.5 mg/dL (ref 8.4–10.5)
Chloride: 107 mEq/L (ref 96–112)
Creatinine, Ser: 0.53 mg/dL (ref 0.40–1.20)
GFR: 92.18 mL/min (ref >60.00)
Glucose, Bld: 94 mg/dL (ref 70–99)
Potassium: 3.9 mEq/L (ref 3.5–5.1)
Sodium: 138 mEq/L (ref 135–145)
Total Bilirubin: 0.7 mg/dL (ref 0.2–1.2)
Total Protein: 6.9 g/dL (ref 6.0–8.3)

## 2022-07-04 LAB — LIPID PANEL
Cholesterol: 281 mg/dL — ABNORMAL HIGH (ref 0–200)
HDL: 54.3 mg/dL (ref >39.00)
LDL Cholesterol: 205 mg/dL — ABNORMAL HIGH (ref 0–99)
NonHDL: 226.96
Total CHOL/HDL Ratio: 5
Triglycerides: 108 mg/dL (ref 0.0–149.0)
VLDL: 21.6 mg/dL (ref 0.0–40.0)

## 2022-07-04 LAB — CBC
HCT: 42.6 % (ref 36.0–46.0)
Hemoglobin: 14.3 g/dL (ref 12.0–15.0)
MCHC: 33.6 g/dL (ref 30.0–36.0)
MCV: 94.7 fl (ref 78.0–100.0)
Platelets: 235 10*3/uL (ref 150.0–400.0)
RBC: 4.5 Mil/uL (ref 3.87–5.11)
RDW: 12.6 % (ref 11.5–15.5)
WBC: 6.4 10*3/uL (ref 4.0–10.5)

## 2022-07-04 MED ORDER — ROSUVASTATIN CALCIUM 20 MG PO TABS: 20.0000 mg | ORAL_TABLET | Freq: Every day | ORAL | 3 refills | Status: DC

## 2022-07-04 NOTE — Assessment & Plan Note (Signed)
No follow up in 4 years and off meds for about 3 years. Checking lipid panel, CBC, CMP and resume meds as needed. She is willing to resume if needed.

## 2022-07-04 NOTE — Assessment & Plan Note (Signed)
Flu shot this fall, covid-19 due this fall. Pneumonia given 20 to complete series. Shingrix declines. Tetanus due at pharmacy. Cologuard ordered. Mammogram due 2025, pap smear not indicated and dexa due unsure if wanted counseled. Counseled about sun safety and mole surveillance. Counseled about the dangers of distracted driving. Given 10 year screening recommendations.

## 2022-07-04 NOTE — Progress Notes (Signed)
Subjective:   Patient ID: Meredith Holden, female    DOB: 1951-01-31, 71 y.o.   MRN: [id number]  HPI New patient visit (last seen 4 years ago) Here for medicare wellness and physical, no new complaints. Please see A/P for status and treatment of chronic medical problems.   Diet: heart healthy Physical activity: walks daily Depression/mood screen: negative Hearing: intact to whispered voice Visual acuity: grossly normal with lens, performs annual eye exam  ADLs: capable Fall risk: none Home safety: good Cognitive evaluation: intact to orientation, naming, recall and repetition EOL planning: adv directives discussed, not in place  [employer] Visit from 07/04/2022 in [facility] at [facility] Total Score 0           12/04/2017    9:52 AM 07/04/2022    8:17 AM  Fall Risk  Falls in the past year? No 0  Was there an injury with Fall?  0  Fall Risk Category Calculator  0  Fall Risk Category  Low  Patient Fall Risk Level  Low fall risk  Patient at Risk for Falls Due to  No Fall Risks  Fall risk Follow up  Falls evaluation completed   I have personally reviewed and have noted 1. The patient's medical and social history - reviewed today no changes 2. Their use of alcohol, tobacco or illicit drugs 3. Their current medications and supplements 4. The patient's functional ability including ADL's, fall risks, home safety risks and hearing or visual impairment. 5. Diet and physical activities 6. Evidence for depression or mood disorders 7. Care team reviewed and updated 8.  The patient is not on an opioid pain medication.  Patient Care Team: Myrlene Broker, MD as PCP - General (Internal Medicine) Past Medical History:  Diagnosis Date   HYPERLIPIDEMIA, SEVERE 08/25/2008   Past Surgical History:  Procedure Laterality Date   ABDOMINAL HYSTERECTOMY  2002   secondary to fibroids   Family History  Problem Relation Age of Onset   Hypertension Mother     Coronary artery disease Father    Heart disease Father        sudden death from MI   Hypertension Sister    Breast cancer Sister        49s   Cancer Sister        uterine cancer; 20 years later breast cancer   Kidney disease Sister    Heart disease Brother        s/p SBE, bioprothetic valve Mitral?   Review of Systems  Constitutional: Negative.   HENT: Negative.    Eyes: Negative.   Respiratory:  Negative for cough, chest tightness and shortness of breath.   Cardiovascular:  Negative for chest pain, palpitations and leg swelling.  Gastrointestinal:  Negative for abdominal distention, abdominal pain, constipation, diarrhea, nausea and vomiting.  Musculoskeletal: Negative.   Skin: Negative.   Neurological: Negative.   Psychiatric/Behavioral: Negative.      Objective:  Physical Exam Constitutional:      Appearance: She is well-developed.  HENT:     Head: Normocephalic and atraumatic.  Cardiovascular:     Rate and Rhythm: Normal rate and regular rhythm.  Pulmonary:     Effort: Pulmonary effort is normal. No respiratory distress.     Breath sounds: Normal breath sounds. No wheezing or rales.  Abdominal:     General: Bowel sounds are normal. There is no distension.     Palpations: Abdomen is soft.     Tenderness:  There is no abdominal tenderness. There is no rebound.  Musculoskeletal:     Cervical back: Normal range of motion.  Skin:    General: Skin is warm and dry.  Neurological:     Mental Status: She is alert and oriented to person, place, and time.     Coordination: Coordination normal.     Vitals:   07/04/22 0811  BP: 120/68  Pulse: 69  Temp: 97.9 F (36.6 C)  TempSrc: Oral  SpO2: 94%  Weight: 127 lb (57.6 kg)  Height: 4\' 11"  (1.499 m)    Assessment & Plan:  Prevnar 20 given at visit

## 2022-07-11 DIAGNOSIS — Z1211 Encounter for screening for malignant neoplasm of colon: Secondary | ICD-10-CM | POA: Diagnosis not present

## 2022-07-17 LAB — COLOGUARD: COLOGUARD: NEGATIVE

## 2023-01-04 DIAGNOSIS — H1045 Other chronic allergic conjunctivitis: Secondary | ICD-10-CM | POA: Diagnosis not present

## 2023-03-15 ENCOUNTER — Telehealth: Payer: Self-pay | Admitting: Internal Medicine

## 2023-03-15 NOTE — Telephone Encounter (Signed)
Called patient to schedule Medicare Annual Wellness Visit (AWV). Left message for patient to call back and schedule Medicare Annual Wellness Visit (AWV).  Last date of AWV: 07/04/2022  Please schedule an appointment at any time with [facility].  If any questions, please contact me at [phone].  Thank you ,  Randon Goldsmith Care Guide [facility] AWV TEAM Direct Dial: [phone]

## 2023-04-18 ENCOUNTER — Ambulatory Visit (INDEPENDENT_AMBULATORY_CARE_PROVIDER_SITE_OTHER): Payer: Medicare HMO

## 2023-04-18 VITALS — Ht 59.0 in | Wt 122.0 lb

## 2023-04-18 DIAGNOSIS — Z0000 Encounter for general adult medical examination without abnormal findings: Secondary | ICD-10-CM

## 2023-04-18 NOTE — Progress Notes (Signed)
Subjective:   Meredith Holden is a 72 y.o. female who presents for Medicare Annual (Subsequent) preventive examination.  Review of Systems    Virtual Visit via Telephone Note  I connected with  Meredith Holden on 04/18/23 at 10:15 AM EDT by telephone and verified that I am speaking with the correct person using two identifiers.  Location: Patient: Home Provider: Office Persons participating in the virtual visit: patient/Nurse Health Advisor   I discussed the limitations, risks, security and privacy concerns of performing an evaluation and management service by telephone and the availability of in person appointments. The patient expressed understanding and agreed to proceed.  Interactive audio and video telecommunications were attempted between this nurse and patient, however failed, due to patient having technical difficulties OR patient did not have access to video capability.  We continued and completed visit with audio only.  Some vital signs may be absent or patient reported.   Meredith Rung, LPN  Cardiac Risk Factors include: advanced age (>51men, >7 women)     Objective:    Today's Vitals   04/18/23 1007  Weight: 122 lb (55.3 kg)  Height: 4\' 11"  (1.499 m)   Body mass index is 24.64 kg/m.     04/18/2023   10:15 AM  Advanced Directives  Does Patient Have a Medical Advance Directive? Yes  Type of Estate agent of [place];Living will  Copy of Healthcare Power of Attorney in Chart? No - copy requested    Current Medications (verified) Outpatient Encounter Medications as of 04/18/2023  Medication Sig   rosuvastatin (CRESTOR) 20 MG tablet Take 1 tablet (20 mg total) by mouth daily.   No facility-administered encounter medications on file as of 04/18/2023.    Allergies (verified) Patient has no known allergies.   History: Past Medical History:  Diagnosis Date   HYPERLIPIDEMIA, SEVERE 08/25/2008   Past Surgical History:  Procedure Laterality Date    ABDOMINAL HYSTERECTOMY  2002   secondary to fibroids   Family History  Problem Relation Age of Onset   Hypertension Mother    Coronary artery disease Father    Heart disease Father        sudden death from MI   Hypertension Sister    Breast cancer Sister        70s   Cancer Sister        uterine cancer; 20 years later breast cancer   Kidney disease Sister    Heart disease Brother        s/p SBE, bioprothetic valve Mitral?   Social History   Socioeconomic History   Marital status: Married    Spouse name: Not on file   Number of children: 2   Years of education: 12   Highest education level: Not on file  Occupational History   Occupation: Data processing manager  Tobacco Use   Smoking status: Never   Smokeless tobacco: Never  Substance and Sexual Activity   Alcohol use: No   Drug use: No   Sexual activity: Not Currently  Other Topics Concern   Not on file  Social History Narrative   HSG/GED. Married "11. 1 son-"67, 1 daughter "85; 3 grand-daughters. Film/video editor; had work in [employer]. Marriage is good   Social Determinants of Corporate investment banker Strain: Low Risk  (04/18/2023)   Overall Financial Resource Strain (CARDIA)    Difficulty of Paying Living Expenses: Not hard at all  Food Insecurity: No Food Insecurity (04/18/2023)   Hunger Vital Sign  Worried About Programme researcher, broadcasting/film/video in the Last Year: Never true    Ran Out of Food in the Last Year: Never true  Transportation Needs: No Transportation Needs (04/18/2023)   PRAPARE - Administrator, Civil Service (Medical): No    Lack of Transportation (Non-Medical): No  Physical Activity: Inactive (04/18/2023)   Exercise Vital Sign    Days of Exercise per Week: 0 days    Minutes of Exercise per Session: 0 min  Stress: No Stress Concern Present (04/18/2023)   Meredith Holden of Occupational Health - Occupational Stress Questionnaire    Feeling of Stress : Not at all  Social Connections: Socially  Integrated (04/18/2023)   Social Connection and Isolation Panel [NHANES]    Frequency of Communication with Friends and Family: More than three times a week    Frequency of Social Gatherings with Friends and Family: More than three times a week    Attends Religious Services: More than 4 times per year    Active Member of [employer] or Organizations: Yes    Attends Engineer, structural: More than 4 times per year    Marital Status: Married    Tobacco Counseling Counseling given: Not Answered   Clinical Intake:  Pre-visit preparation completed: No  Pain : No/denies pain     BMI - recorded: 24.64 Nutritional Status: BMI of 19-24  Normal Nutritional Risks: None Diabetes: No  How often do you need to have someone help you when you read instructions, pamphlets, or other written materials from your doctor or pharmacy?: 1 - Never  Diabetic?  No  Interpreter Needed?: No  Information entered by :: Meredith Mulligan LPN   Activities of Daily Living    04/18/2023   10:14 AM  In your present state of health, do you have any difficulty performing the following activities:  Hearing? 0  Vision? 0  Difficulty concentrating or making decisions? 0  Walking or climbing stairs? 0  Dressing or bathing? 0  Doing errands, shopping? 0  Preparing Food and eating ? N  Using the Toilet? N  In the past six months, have you accidently leaked urine? N  Do you have problems with loss of bowel control? N  Managing your Medications? N  Managing your Finances? N  Housekeeping or managing your Housekeeping? N    Patient Care Team: Meredith Broker, MD as PCP - General (Internal Medicine)  Indicate any recent Medical Services you may have received from other than [facility] providers in the past year (date may be approximate).     Assessment:   This is a routine wellness examination for [facility].  Hearing/Vision screen Hearing Screening - Comments:: Denies hearing difficulties   Vision Screening  - Comments:: Wears rx glasses - up to date with routine eye exams with  [facility]  Dietary issues and exercise activities discussed: Exercise limited by: None identified   Goals Addressed               This Visit's Progress     Keep weight down (pt-stated)         Depression Screen    04/18/2023   10:12 AM 07/04/2022    8:18 AM 12/04/2017    9:52 AM  PHQ 2/9 Scores  PHQ - 2 Score 0 0 0    Fall Risk    04/18/2023   10:14 AM 07/04/2022    8:17 AM 12/04/2017    9:52 AM  Fall Risk   Falls  in the past year? 0 0 No  Number falls in past yr: 0 0   Injury with Fall? 0 0   Risk for fall due to : No Fall Risks No Fall Risks   Follow up Falls prevention discussed Falls evaluation completed     FALL RISK PREVENTION PERTAINING TO THE HOME:  Any stairs in or around the home? Yes  If so, are there any without handrails? No  Home free of loose throw rugs in walkways, pet beds, electrical cords, etc? Yes  Adequate lighting in your home to reduce risk of falls? Yes   ASSISTIVE DEVICES UTILIZED TO PREVENT FALLS:  Life alert? No  Use of a cane, walker or w/c? No  Grab bars in the bathroom? Yes  Shower chair or bench in shower? No  Elevated toilet seat or a handicapped toilet? No   TIMED UP AND GO:  Was the test performed? No . Audio Visit   Cognitive Function:        04/18/2023   10:15 AM  6CIT Screen  What Year? 0 points  What month? 0 points  What time? 0 points  Count back from 20 0 points  Months in reverse 0 points  Repeat phrase 0 points  Total Score 0 points    Immunizations Immunization History  Administered Date(s) Administered   Influenza Split 09/26/2011   Influenza Whole 07/31/2008, 08/15/2009, 09/06/2010   Influenza, High Dose Seasonal PF 08/17/2018   Influenza,inj,Quad PF,6+ Mos 08/04/2014, 08/14/2017   PNEUMOCOCCAL CONJUGATE-20 07/04/2022   Pneumococcal Polysaccharide-23 12/04/2017   Tdap 09/26/2011    TDAP status: Due, Education has been  provided regarding the importance of this vaccine. Advised may receive this vaccine at local pharmacy or Health Dept. Aware to provide a copy of the vaccination record if obtained from local pharmacy or Health Dept. Verbalized acceptance and understanding.  Flu Vaccine status: Up to date  Pneumococcal vaccine status: Up to date  Covid-19 vaccine status: Completed vaccines   Screening Tests Health Maintenance  Topic Date Due   Hepatitis C Screening  Never done   DEXA SCAN  Never done   DTaP/Tdap/Td (2 - Td or Tdap) 09/25/2021   COVID-19 Vaccine (1) 05/04/2023 (Originally 04/03/1952)   INFLUENZA VACCINE  06/15/2023   MAMMOGRAM  04/05/2024   Medicare Annual Wellness (AWV)  04/17/2024   Colonoscopy  07/11/2032   Pneumonia Vaccine 12+ Years old  Completed   HPV VACCINES  Aged Out   Zoster Vaccines- Shingrix  Discontinued    Health Maintenance  Health Maintenance Due  Topic Date Due   Hepatitis C Screening  Never done   DEXA SCAN  Never done   DTaP/Tdap/Td (2 - Td or Tdap) 09/25/2021    Colorectal cancer screening: Type of screening: Colonoscopy. Completed 07/11/22. Repeat every 2 years  Mammogram status: Completed 04/05/22. Repeat every year  Bone Density status: Ordered Patient declined. Pt provided with contact info and advised to call to schedule appt.  Lung Cancer Screening: (Low Dose CT Chest recommended if Age 49-80 years, 30 pack-year currently smoking OR have quit w/in 15years.) does not qualify.     Additional Screening:  Hepatitis C Screening: does qualify; Deferred  Vision Screening: Recommended annual ophthalmology exams for early detection of glaucoma and other disorders of the eye. Is the patient up to date with their annual eye exam?  Yes  Who is the provider or what is the name of the office in which the patient attends annual eye exams? [facility] If pt is not established with a provider, would they like to be referred to a provider to establish care?  No .   Dental Screening: Recommended annual dental exams for proper oral hygiene  Community Resource Referral / Chronic Care Management:  CRR required this visit?  No   CCM required this visit?  No      Holden:     I have personally reviewed and noted the following in the patient's chart:   Medical and social history Use of alcohol, tobacco or illicit drugs  Current medications and supplements including opioid prescriptions. Patient is not currently taking opioid prescriptions. Functional ability and status Nutritional status Physical activity Advanced directives List of other physicians Hospitalizations, surgeries, and ER visits in previous 12 months Vitals Screenings to include cognitive, depression, and falls Referrals and appointments  In addition, I have reviewed and discussed with patient certain preventive protocols, quality metrics, and best practice recommendations. A written personalized care Holden for preventive services as well as general preventive health recommendations were provided to patient.     Meredith Rung, LPN   11/19/1094   Nurse Notes: Patient due hep-C Screening

## 2023-04-18 NOTE — Patient Instructions (Addendum)
Meredith Holden , Thank you for taking time to come for your Medicare Wellness Visit. I appreciate your ongoing commitment to your health goals. Please review the following plan we discussed and let me know if I can assist you in the future.   These are the goals we discussed:  Goals       Keep weight down (pt-stated)        This is a list of the screening recommended for you and due dates:  Health Maintenance  Topic Date Due   Hepatitis C Screening  Never done   DEXA scan (bone density measurement)  Never done   DTaP/Tdap/Td vaccine (2 - Td or Tdap) 09/25/2021   COVID-19 Vaccine (1) 05/04/2023*   Flu Shot  06/15/2023   Mammogram  04/05/2024   Medicare Annual Wellness Visit  04/17/2024   Colon Cancer Screening  07/11/2032   Pneumonia Vaccine  Completed   HPV Vaccine  Aged Out   Zoster (Shingles) Vaccine  Discontinued  *Topic was postponed. The date shown is not the original due date.    Advanced directives: Please bring a copy of your health care power of attorney and living will to the office to be added to your chart at your convenience.   Conditions/risks identified: None  Next appointment: Follow up in one year for your annual wellness visit    Preventive Care 65 Years and Older, Female Preventive care refers to lifestyle choices and visits with your health care provider that can promote health and wellness. What does preventive care include? A yearly physical exam. This is also called an annual well check. Dental exams once or twice a year. Routine eye exams. Ask your health care provider how often you should have your eyes checked. Personal lifestyle choices, including: Daily care of your teeth and gums. Regular physical activity. Eating a healthy diet. Avoiding tobacco and drug use. Limiting alcohol use. Practicing safe sex. Taking low-dose aspirin every day. Taking vitamin and mineral supplements as recommended by your health care provider. What happens during an  annual well check? The services and screenings done by your health care provider during your annual well check will depend on your age, overall health, lifestyle risk factors, and family history of disease. Counseling  Your health care provider may ask you questions about your: Alcohol use. Tobacco use. Drug use. Emotional well-being. Home and relationship well-being. Sexual activity. Eating habits. History of falls. Memory and ability to understand (cognition). Work and work Astronomer. Reproductive health. Screening  You may have the following tests or measurements: Height, weight, and BMI. Blood pressure. Lipid and cholesterol levels. These may be checked every 5 years, or more frequently if you are over 72 years old. Skin check. Lung cancer screening. You may have this screening every year starting at age 73 if you have a 30-pack-year history of smoking and currently smoke or have quit within the past 15 years. Fecal occult blood test (FOBT) of the stool. You may have this test every year starting at age 61. Flexible sigmoidoscopy or colonoscopy. You may have a sigmoidoscopy every 5 years or a colonoscopy every 10 years starting at age 28. Hepatitis C blood test. Hepatitis B blood test. Sexually transmitted disease (STD) testing. Diabetes screening. This is done by checking your blood sugar (glucose) after you have not eaten for a while (fasting). You may have this done every 1-3 years. Bone density scan. This is done to screen for osteoporosis. You may have this done starting at age  65. Mammogram. This may be done every 1-2 years. Talk to your health care provider about how often you should have regular mammograms. Talk with your health care provider about your test results, treatment options, and if necessary, the need for more tests. Vaccines  Your health care provider may recommend certain vaccines, such as: Influenza vaccine. This is recommended every year. Tetanus,  diphtheria, and acellular pertussis (Tdap, Td) vaccine. You may need a Td booster every 10 years. Zoster vaccine. You may need this after age 58. Pneumococcal 13-valent conjugate (PCV13) vaccine. One dose is recommended after age 22. Pneumococcal polysaccharide (PPSV23) vaccine. One dose is recommended after age 12. Talk to your health care provider about which screenings and vaccines you need and how often you need them. This information is not intended to replace advice given to you by your health care provider. Make sure you discuss any questions you have with your health care provider. Document Released: 11/27/2015 Document Revised: 07/20/2016 Document Reviewed: 09/01/2015 Elsevier Interactive Patient Education  2017 [employer].  Fall Prevention in the Home Falls can cause injuries. They can happen to people of all ages. There are many things you can do to make your home safe and to help prevent falls. What can I do on the outside of my home? Regularly fix the edges of walkways and driveways and fix any cracks. Remove anything that might make you trip as you walk through a door, such as a raised step or threshold. Trim any bushes or trees on the path to your home. Use bright outdoor lighting. Clear any walking paths of anything that might make someone trip, such as rocks or tools. Regularly check to see if handrails are loose or broken. Make sure that both sides of any steps have handrails. Any raised decks and porches should have guardrails on the edges. Have any leaves, snow, or ice cleared regularly. Use sand or salt on walking paths during winter. Clean up any spills in your garage right away. This includes oil or grease spills. What can I do in the bathroom? Use night lights. Install grab bars by the toilet and in the tub and shower. Do not use towel bars as grab bars. Use non-skid mats or decals in the tub or shower. If you need to sit down in the shower, use a plastic,  non-slip stool. Keep the floor dry. Clean up any water that spills on the floor as soon as it happens. Remove soap buildup in the tub or shower regularly. Attach bath mats securely with double-sided non-slip rug tape. Do not have throw rugs and other things on the floor that can make you trip. What can I do in the bedroom? Use night lights. Make sure that you have a light by your bed that is easy to reach. Do not use any sheets or blankets that are too big for your bed. They should not hang down onto the floor. Have a firm chair that has side arms. You can use this for support while you get dressed. Do not have throw rugs and other things on the floor that can make you trip. What can I do in the kitchen? Clean up any spills right away. Avoid walking on wet floors. Keep items that you use a lot in easy-to-reach places. If you need to reach something above you, use a strong step stool that has a grab bar. Keep electrical cords out of the way. Do not use floor polish or wax that makes floors slippery.  If you must use wax, use non-skid floor wax. Do not have throw rugs and other things on the floor that can make you trip. What can I do with my stairs? Do not leave any items on the stairs. Make sure that there are handrails on both sides of the stairs and use them. Fix handrails that are broken or loose. Make sure that handrails are as long as the stairways. Check any carpeting to make sure that it is firmly attached to the stairs. Fix any carpet that is loose or worn. Avoid having throw rugs at the top or bottom of the stairs. If you do have throw rugs, attach them to the floor with carpet tape. Make sure that you have a light switch at the top of the stairs and the bottom of the stairs. If you do not have them, ask someone to add them for you. What else can I do to help prevent falls? Wear shoes that: Do not have high heels. Have rubber bottoms. Are comfortable and fit you well. Are closed  at the toe. Do not wear sandals. If you use a stepladder: Make sure that it is fully opened. Do not climb a closed stepladder. Make sure that both sides of the stepladder are locked into place. Ask someone to hold it for you, if possible. Clearly mark and make sure that you can see: Any grab bars or handrails. First and last steps. Where the edge of each step is. Use tools that help you move around (mobility aids) if they are needed. These include: Canes. Walkers. Scooters. Crutches. Turn on the lights when you go into a dark area. Replace any light bulbs as soon as they burn out. Set up your furniture so you have a clear path. Avoid moving your furniture around. If any of your floors are uneven, fix them. If there are any pets around you, be aware of where they are. Review your medicines with your doctor. Some medicines can make you feel dizzy. This can increase your chance of falling. Ask your doctor what other things that you can do to help prevent falls. This information is not intended to replace advice given to you by your health care provider. Make sure you discuss any questions you have with your health care provider. Document Released: 08/27/2009 Document Revised: 04/07/2016 Document Reviewed: 12/05/2014 Elsevier Interactive Patient Education  2017 [employer].

## 2023-05-04 IMAGING — MG MM DIGITAL SCREENING BILAT W/ TOMO AND CAD
8 series · 8 of 24 positions shown · non-contrast
Comparison: Previous exam(s).

CLINICAL DATA: Screening.

EXAM:
DIGITAL SCREENING BILATERAL MAMMOGRAM WITH TOMOSYNTHESIS AND CAD
TECHNIQUE: Bilateral screening digital craniocaudal and mediolateral oblique
mammograms were obtained. Bilateral screening digital breast
tomosynthesis was performed. The images were evaluated with
computer-aided detection.

[L CC synth-2D]
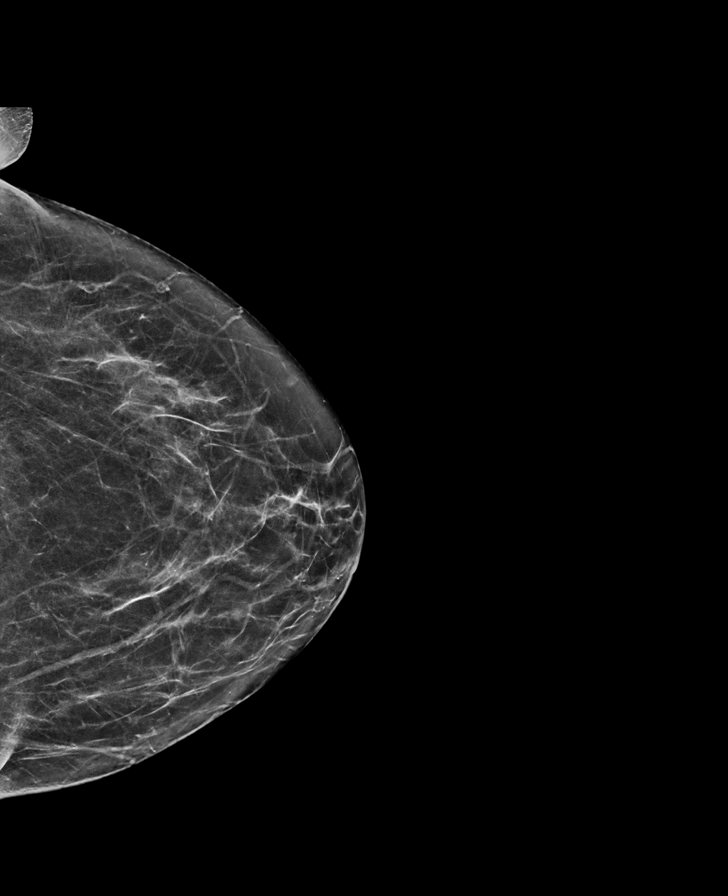

[R MLO synth-2D]
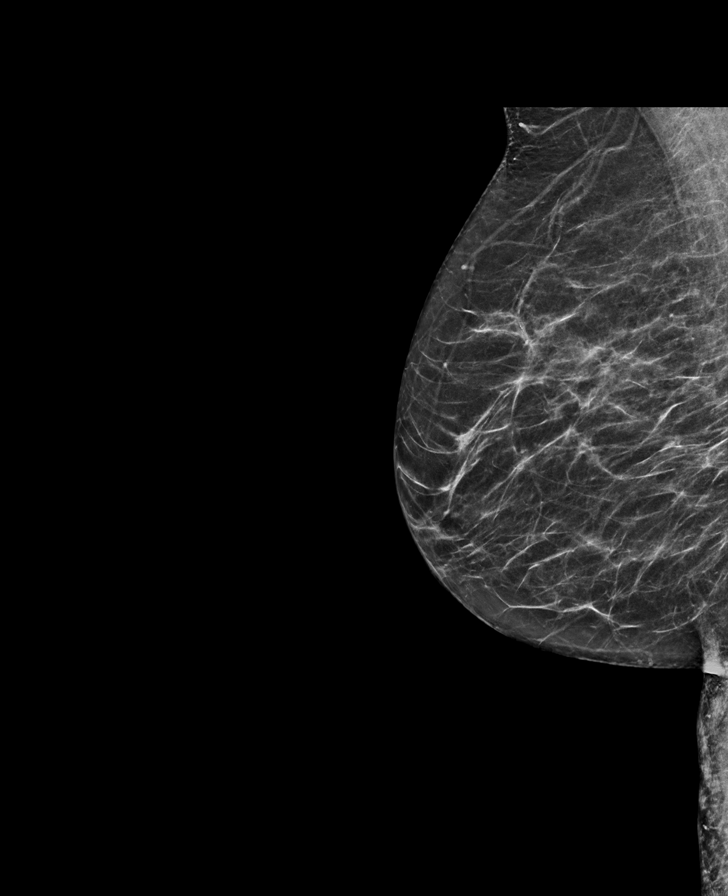

[R CC synth-2D]
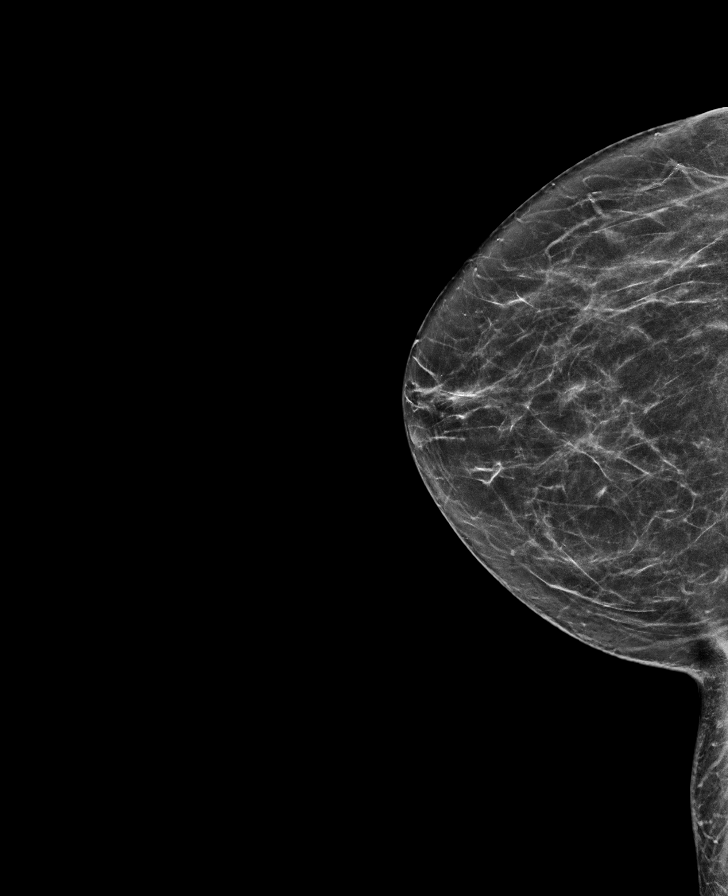

[L MLO synth-2D]
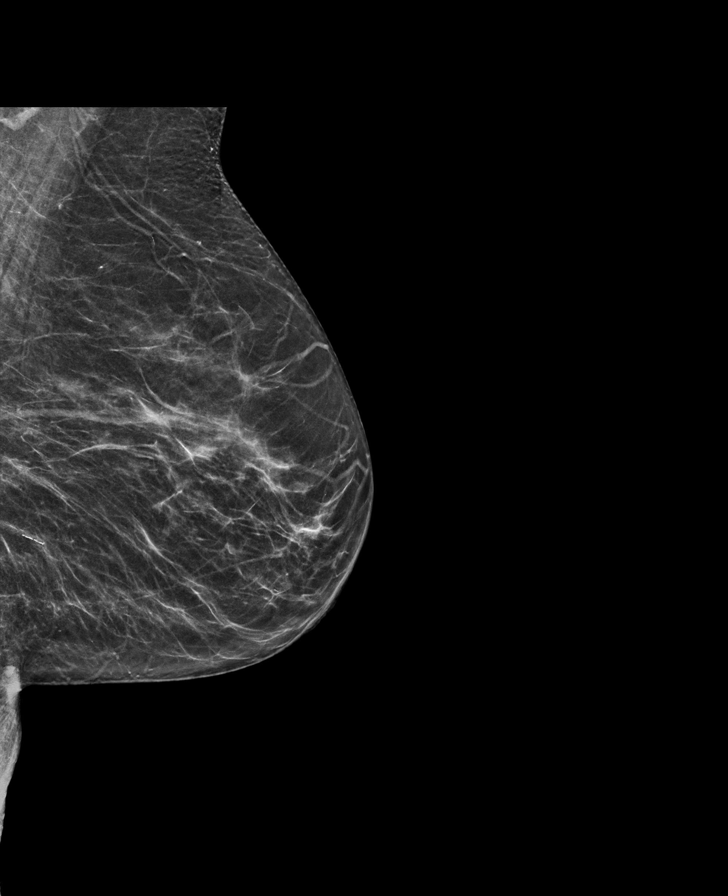

[R MLO tomo · tomo slice 31/60.0]
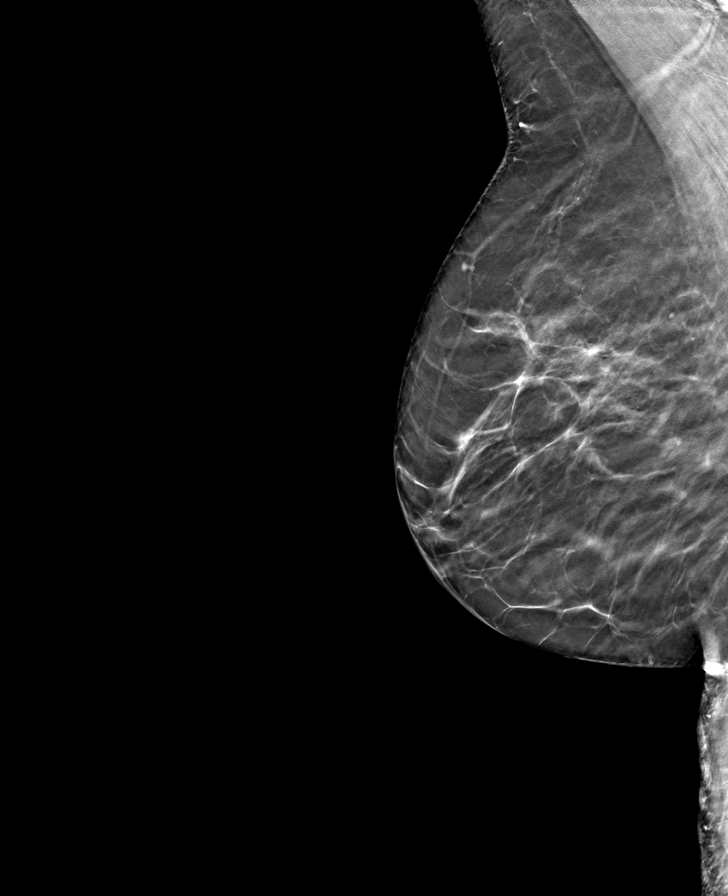

[R CC tomo · tomo slice 32/63.0]
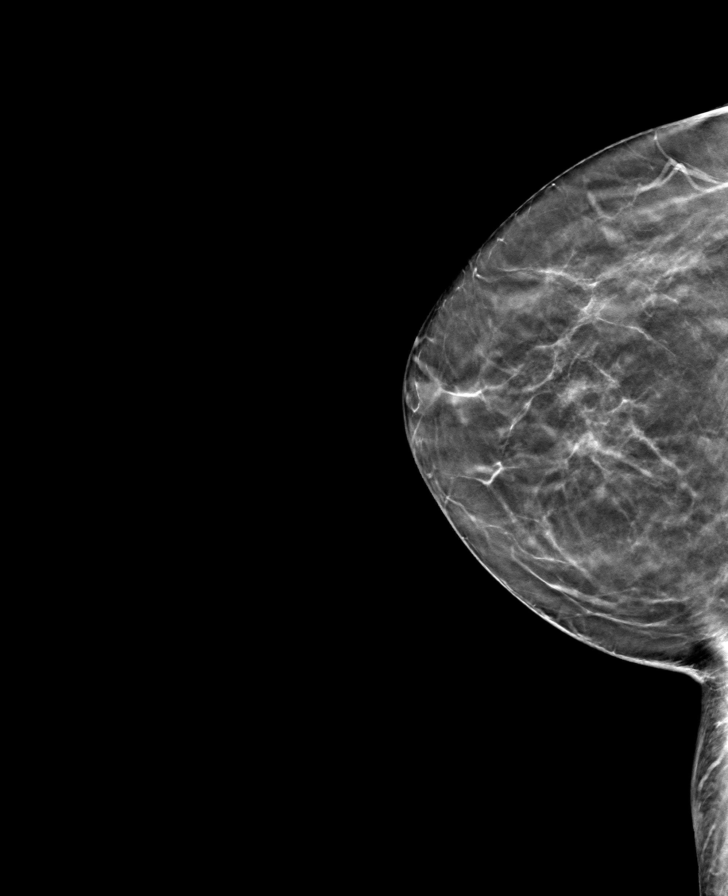

[L CC tomo · tomo slice 30/59.0]
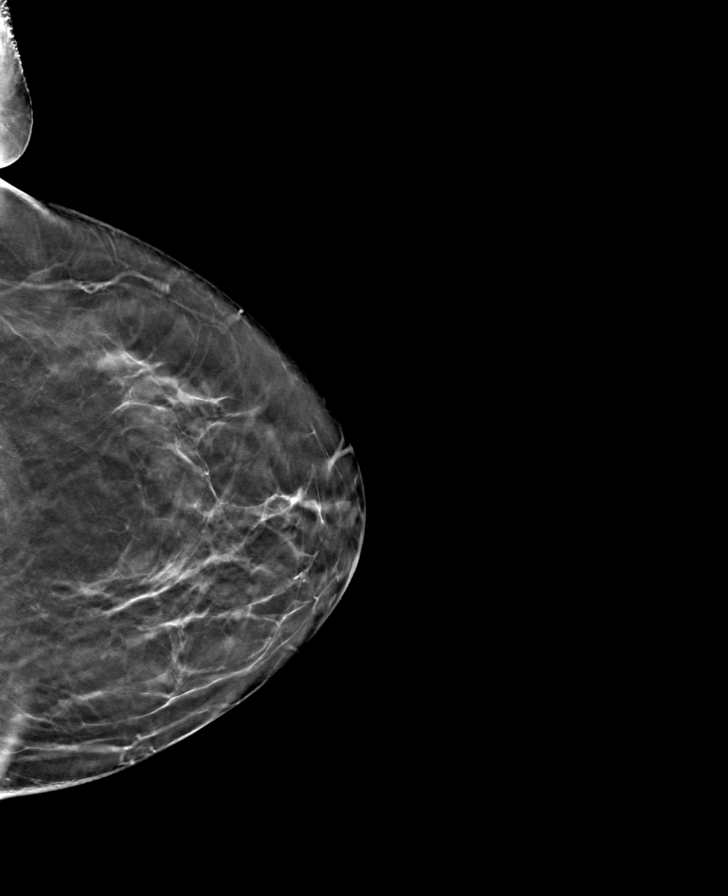

[L MLO tomo · tomo slice 29/58.0]
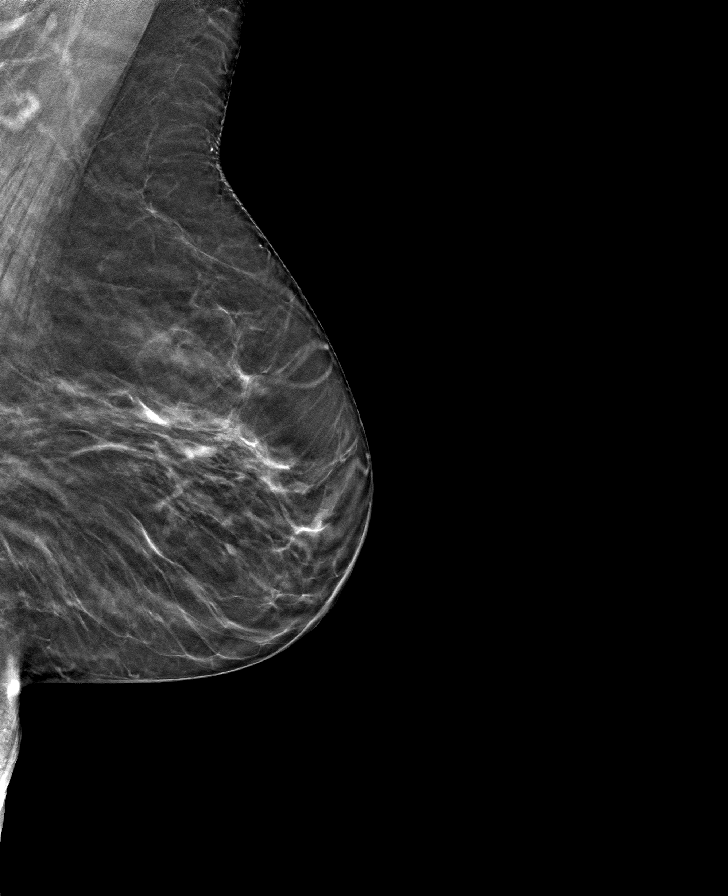

[8 of 24 positions shown; findings below may reference images not displayed]

ACR Breast Density Category b: There are scattered areas of
fibroglandular density.
FINDINGS: There are no findings suspicious for malignancy.
IMPRESSION: No mammographic evidence of malignancy. A result letter of this
screening mammogram will be mailed directly to the patient.

RECOMMENDATION:
Screening mammogram in one year. (Code:51-O-LD2)

BI-RADS CATEGORY  1: Negative.

## 2023-06-16 ENCOUNTER — Other Ambulatory Visit: Payer: Self-pay | Admitting: Internal Medicine

## 2023-07-06 ENCOUNTER — Encounter: Payer: Self-pay | Admitting: Internal Medicine

## 2023-07-06 ENCOUNTER — Ambulatory Visit (INDEPENDENT_AMBULATORY_CARE_PROVIDER_SITE_OTHER): Payer: Medicare HMO | Admitting: Internal Medicine

## 2023-07-06 VITALS — BP 122/82 | HR 75 | Temp 98.1°F | Ht 59.0 in | Wt 120.0 lb

## 2023-07-06 DIAGNOSIS — Z0000 Encounter for general adult medical examination without abnormal findings: Secondary | ICD-10-CM

## 2023-07-06 DIAGNOSIS — E782 Mixed hyperlipidemia: Secondary | ICD-10-CM | POA: Diagnosis not present

## 2023-07-06 LAB — COMPREHENSIVE METABOLIC PANEL
ALT: 17 U/L (ref 0–35)
AST: 21 U/L (ref 0–37)
Albumin: 4.1 g/dL (ref 3.5–5.2)
Alkaline Phosphatase: 66 U/L (ref 39–117)
BUN: 17 mg/dL (ref 6–23)
CO2: 29 mEq/L (ref 19–32)
Calcium: 9.4 mg/dL (ref 8.4–10.5)
Chloride: 105 mEq/L (ref 96–112)
Creatinine, Ser: 0.65 mg/dL (ref 0.40–1.20)
GFR: 87.14 mL/min (ref >60.00)
Glucose, Bld: 98 mg/dL (ref 70–99)
Potassium: 4 mEq/L (ref 3.5–5.1)
Sodium: 140 mEq/L (ref 135–145)
Total Bilirubin: 0.7 mg/dL (ref 0.2–1.2)
Total Protein: 6.8 g/dL (ref 6.0–8.3)

## 2023-07-06 LAB — CBC
HCT: 42.8 % (ref 36.0–46.0)
Hemoglobin: 13.9 g/dL (ref 12.0–15.0)
MCHC: 32.4 g/dL (ref 30.0–36.0)
MCV: 94.1 fl (ref 78.0–100.0)
Platelets: 209 10*3/uL (ref 150.0–400.0)
RBC: 4.55 Mil/uL (ref 3.87–5.11)
RDW: 13.2 % (ref 11.5–15.5)
WBC: 6.7 10*3/uL (ref 4.0–10.5)

## 2023-07-06 LAB — LIPID PANEL
Cholesterol: 148 mg/dL (ref 0–200)
HDL: 57.2 mg/dL (ref >39.00)
LDL Cholesterol: 77 mg/dL (ref 0–99)
NonHDL: 91.28
Total CHOL/HDL Ratio: 3
Triglycerides: 70 mg/dL (ref 0.0–149.0)
VLDL: 14 mg/dL (ref 0.0–40.0)

## 2023-07-06 MED ORDER — ROSUVASTATIN CALCIUM 20 MG PO TABS: 20.0000 mg | ORAL_TABLET | Freq: Every day | ORAL | 0 refills | Status: DC

## 2023-07-06 NOTE — Patient Instructions (Addendum)
Blood pressure should be <140/90 so check 1-2 times per month and let us know if running high.  Get the tdap (tetanus booster) at the pharmacy

## 2023-07-06 NOTE — Progress Notes (Signed)
   Subjective:   Patient ID: Meredith Holden, female    DOB: [birth year], 72 y.o.   MRN: [id number]  HPI The patient is here for physical.  PMH, [facility], social history reviewed and updated  Review of Systems  Constitutional: Negative.   HENT: Negative.    Eyes: Negative.   Respiratory:  Negative for cough, chest tightness and shortness of breath.   Cardiovascular:  Negative for chest pain, palpitations and leg swelling.  Gastrointestinal:  Negative for abdominal distention, abdominal pain, constipation, diarrhea, nausea and vomiting.  Musculoskeletal: Negative.   Skin: Negative.   Neurological: Negative.   Psychiatric/Behavioral: Negative.      Objective:  Physical Exam Constitutional:      Appearance: She is well-developed.  HENT:     Head: Normocephalic and atraumatic.  Cardiovascular:     Rate and Rhythm: Normal rate and regular rhythm.  Pulmonary:     Effort: Pulmonary effort is normal. No respiratory distress.     Breath sounds: Normal breath sounds. No wheezing or rales.  Abdominal:     General: Bowel sounds are normal. There is no distension.     Palpations: Abdomen is soft.     Tenderness: There is no abdominal tenderness. There is no rebound.  Musculoskeletal:     Cervical back: Normal range of motion.  Skin:    General: Skin is warm and dry.  Neurological:     Mental Status: She is alert and oriented to person, place, and time.     Coordination: Coordination normal.     Vitals:   07/06/23 1056 07/06/23 1058 07/06/23 1110  BP: (!) 120/100 (!) 120/100 122/82  Pulse: 75    Temp: 98.1 F (36.7 C)    TempSrc: Oral    SpO2: 95%    Weight: 120 lb (54.4 kg)    Height: 4\' 11"  (1.499 m)      Assessment & Plan:

## 2023-07-07 NOTE — Assessment & Plan Note (Signed)
Flu shot yearly. Pneumonia complete. Shingrix due at pharmacy. Tetanus due at pharmacy. Cologuard up to date. Mammogram up to date, pap smear aged out and dexa declines today has had previously we do not have records. Counseled about sun safety and mole surveillance. Counseled about the dangers of distracted driving. Given 10 year screening recommendations.

## 2023-07-07 NOTE — Assessment & Plan Note (Signed)
Checking lipid panel and adjust crestor 20 mg daily as needed.  

## 2023-11-07 ENCOUNTER — Other Ambulatory Visit: Payer: Self-pay | Admitting: Internal Medicine

## 2024-03-10 ENCOUNTER — Other Ambulatory Visit: Payer: Self-pay | Admitting: Internal Medicine

## 2024-04-22 ENCOUNTER — Ambulatory Visit (INDEPENDENT_AMBULATORY_CARE_PROVIDER_SITE_OTHER): Payer: Medicare HMO

## 2024-04-22 VITALS — Ht 59.0 in | Wt 115.0 lb

## 2024-04-22 DIAGNOSIS — Z1159 Encounter for screening for other viral diseases: Secondary | ICD-10-CM | POA: Diagnosis not present

## 2024-04-22 DIAGNOSIS — Z1231 Encounter for screening mammogram for malignant neoplasm of breast: Secondary | ICD-10-CM | POA: Diagnosis not present

## 2024-04-22 DIAGNOSIS — Z0000 Encounter for general adult medical examination without abnormal findings: Secondary | ICD-10-CM | POA: Diagnosis not present

## 2024-04-22 NOTE — Progress Notes (Signed)
 Subjective:   Meredith Holden is a 73 y.o. who presents for a Medicare Wellness preventive visit.  As a reminder, Annual Wellness Visits don't include a physical exam, and some assessments may be limited, especially if this visit is performed virtually. We may recommend an in-person follow-up visit with your provider if needed.  Visit Complete: Virtual I connected with  Meredith Holden on 04/22/24 by a audio enabled telemedicine application and verified that I am speaking with the correct person using two identifiers.  Patient Location: Home  Provider Location: Office/Clinic  I discussed the limitations of evaluation and management by telemedicine. The patient expressed understanding and agreed to proceed.  Vital Signs: Because this visit was a virtual/telehealth visit, some criteria may be missing or patient reported. Any vitals not documented were not able to be obtained and vitals that have been documented are patient reported.  VideoDeclined- This patient declined Librarian, academic. Therefore the visit was completed with audio only.  Persons Participating in Visit: Patient.  AWV Questionnaire: No: Patient Medicare AWV questionnaire was not completed prior to this visit.  Cardiac Risk Factors include: advanced age (>40men, >59 women);dyslipidemia     Objective:     Today's Vitals   04/22/24 1548  Weight: 115 lb (52.2 kg)  Height: 4\' 11"  (1.499 m)   Body mass index is 23.23 kg/m.     04/22/2024    3:47 PM 04/18/2023   10:15 AM  Advanced Directives  Does Patient Have a Medical Advance Directive? Yes Yes  Type of Estate agent of [place];Living will Healthcare Power of [place];Living will  Copy of Healthcare Power of Attorney in Chart? No - copy requested No - copy requested    Current Medications (verified) Outpatient Encounter Medications as of 04/22/2024  Medication Sig   rosuvastatin  (CRESTOR ) 20 MG tablet TAKE 1 TABLET BY  MOUTH EVERY DAY   No facility-administered encounter medications on file as of 04/22/2024.    Allergies (verified) Patient has no known allergies.   History: Past Medical History:  Diagnosis Date   HYPERLIPIDEMIA, SEVERE 08/25/2008   Past Surgical History:  Procedure Laterality Date   ABDOMINAL HYSTERECTOMY  2002   secondary to fibroids   Family History  Problem Relation Age of Onset   Hypertension Mother    Coronary artery disease Father    Heart disease Father        sudden death from MI   Hypertension Sister    Breast cancer Sister        78s   Cancer Sister        uterine cancer; 20 years later breast cancer   Kidney disease Sister    Heart disease Brother        s/p SBE, bioprothetic valve Mitral?   Social History   Socioeconomic History   Marital status: Married    Spouse name: Not on file   Number of children: 2   Years of education: 12   Highest education level: Not on file  Occupational History   Occupation: Data processing manager  Tobacco Use   Smoking status: Never   Smokeless tobacco: Never  Substance and Sexual Activity   Alcohol use: No   Drug use: No   Sexual activity: Not Currently  Other Topics Concern   Not on file  Social History Narrative   HSG/GED. Married "99. 1 son-"67, 1 daughter "12; 3 grand-daughters. Film/video editor; had work in [employer]. Marriage is good   Social Drivers of Health  Financial Resource Strain: Low Risk  (04/22/2024)   Overall Financial Resource Strain (CARDIA)    Difficulty of Paying Living Expenses: Not hard at all  Food Insecurity: No Food Insecurity (04/22/2024)   Hunger Vital Sign    Worried About Running Out of Food in the Last Year: Never true    Ran Out of Food in the Last Year: Never true  Transportation Needs: No Transportation Needs (04/22/2024)   PRAPARE - Administrator, Civil Service (Medical): No    Lack of Transportation (Non-Medical): No  Physical Activity: Sufficiently Active (04/22/2024)    Exercise Vital Sign    Days of Exercise per Week: 7 days    Minutes of Exercise per Session: 60 min  Stress: No Stress Concern Present (04/22/2024)   [name] of Occupational Health - Occupational Stress Questionnaire    Feeling of Stress : Not at all  Social Connections: Socially Integrated (04/22/2024)   Social Connection and Isolation Panel [NHANES]    Frequency of Communication with Friends and Family: More than three times a week    Frequency of Social Gatherings with Friends and Family: More than three times a week    Attends Religious Services: More than 4 times per year    Active Member of [employer] or Organizations: Yes    Attends Engineer, structural: More than 4 times per year    Marital Status: Married    Tobacco Counseling Counseling given: No    Clinical Intake:  Pre-visit preparation completed: Yes  Pain : No/denies pain     BMI - recorded: 23.23 Nutritional Status: BMI of 19-24  Normal Nutritional Risks: None Diabetes: No  No results found for: "HGBA1C"   How often do you need to have someone help you when you read instructions, pamphlets, or other written materials from your doctor or pharmacy?: 1 - Never  Interpreter Needed?: No  Information entered by :: Kandy Orris, CMA   Activities of Daily Living     04/22/2024    3:51 PM  In your present state of health, do you have any difficulty performing the following activities:  Hearing? 0  Vision? 0  Difficulty concentrating or making decisions? 0  Walking or climbing stairs? 0  Dressing or bathing? 0  Doing errands, shopping? 0  Preparing Food and eating ? N  Using the Toilet? N  In the past six months, have you accidently leaked urine? N  Do you have problems with loss of bowel control? N  Managing your Medications? N  Managing your Finances? N  Housekeeping or managing your Housekeeping? N    Patient Care Team: Adelia Homestead, MD as PCP - [facility], [facility] (Optometry) [place], [facility] (Optometry)  I have updated your Care Teams any recent Medical Services you may have received from other providers in the past year.     Assessment:    This is a routine wellness examination for [facility].  Hearing/Vision screen Hearing Screening - Comments:: Denies hearing difficulties   Vision Screening - Comments:: Wears eyeglasses - pt plans to schedule an appt w/[facility]   Goals Addressed               This Visit's Progress     Keep weight down (pt-stated)        Patient stated she stays active working in the yard due to having much acres       Depression Screen  04/22/2024    3:52 PM 04/18/2023   10:12 AM 07/04/2022    8:18 AM 12/04/2017    9:52 AM  PHQ 2/9 Scores  PHQ - 2 Score 0 0 0 0  PHQ- 9 Score 0       Fall Risk     04/22/2024    3:51 PM 07/06/2023   10:58 AM 04/18/2023   10:14 AM 07/04/2022    8:17 AM 12/04/2017    9:52 AM  Fall Risk   Falls in the past year? 0 0 0 0 No  Number falls in past yr: 0 0 0 0   Injury with Fall? 0 0 0 0   Risk for fall due to : No Fall Risks  No Fall Risks No Fall Risks   Follow up Falls evaluation completed;Falls prevention discussed Falls evaluation completed Falls prevention discussed Falls evaluation completed     MEDICARE RISK AT HOME:  Medicare Risk at Home Any stairs in or around the home?: Yes (outside) If so, are there any without handrails?: No Home free of loose throw rugs in walkways, pet beds, electrical cords, etc?: Yes Adequate lighting in your home to reduce risk of falls?: Yes Life alert?: No Use of a cane, walker or w/c?: No Grab bars in the bathroom?: Yes Shower chair or bench in shower?: Yes Elevated toilet seat or a handicapped toilet?: No  TIMED UP AND GO:  Was the test performed?  No  Cognitive Function: 6CIT completed        04/22/2024    3:54 PM 04/18/2023   10:15 AM  6CIT Screen  What Year? 0 points 0 points   What month? 0 points 0 points  What time? 0 points 0 points  Count back from 20 0 points 0 points  Months in reverse 0 points 0 points  Repeat phrase 0 points 0 points  Total Score 0 points 0 points    Immunizations Immunization History  Administered Date(s) Administered   Influenza Split 09/26/2011   Influenza Whole 07/31/2008, 08/15/2009, 09/06/2010   Influenza, High Dose Seasonal PF 08/17/2018   Influenza,inj,Quad PF,6+ Mos 08/04/2014, 08/14/2017   PNEUMOCOCCAL CONJUGATE-20 07/04/2022   Pneumococcal Polysaccharide-23 12/04/2017   Tdap 09/26/2011    Screening Tests Health Maintenance  Topic Date Due   Hepatitis C Screening  Never done   Zoster Vaccines- Shingrix (1 of 2) Never done   DTaP/Tdap/Td (2 - Td or Tdap) 09/25/2021   COVID-19 Vaccine (1 - 2024-25 season) Never done   MAMMOGRAM  04/05/2024   DEXA SCAN  04/22/2025 (Originally 10/04/2014)   INFLUENZA VACCINE  06/14/2024   Medicare Annual Wellness (AWV)  04/22/2025   Fecal DNA (Cologuard)  07/11/2025   Pneumonia Vaccine 73+ Years old  Completed   HPV VACCINES  Aged Out   Meningococcal B Vaccine  Aged Out   Colonoscopy  Discontinued    Health Maintenance  Health Maintenance Due  Topic Date Due   Hepatitis C Screening  Never done   Zoster Vaccines- Shingrix (1 of 2) Never done   DTaP/Tdap/Td (2 - Td or Tdap) 09/25/2021   COVID-19 Vaccine (1 - 2024-25 season) Never done   MAMMOGRAM  04/05/2024   Health Maintenance Items Addressed:  Mammogram ordered, Hepatitis C Screening   I have recommended that this patient have a DEXA Scan but she declines at this time. I have discussed the risks and benefits of this procedure with her. The patient verbalizes understanding.    Additional Screening:  Vision Screening:  Recommended annual ophthalmology exams for early detection of glaucoma and other disorders of the eye. Pt plans to schedule an appt w/[facility] in [place], [place] for an annual eye exam.   Dental  Screening: Recommended annual dental exams for proper oral hygiene  Community Resource Referral / Chronic Care Management: CRR required this visit?  No   CCM required this visit?  No   Plan:    I have personally reviewed and noted the following in the patient's chart:   Medical and social history Use of alcohol, tobacco or illicit drugs  Current medications and supplements including opioid prescriptions. Patient is not currently taking opioid prescriptions. Functional ability and status Nutritional status Physical activity Advanced directives List of other physicians Hospitalizations, surgeries, and ER visits in previous 12 months Vitals Screenings to include cognitive, depression, and falls Referrals and appointments  In addition, I have reviewed and discussed with patient certain preventive protocols, quality metrics, and best practice recommendations. A written personalized care plan for preventive services as well as general preventive health recommendations were provided to patient.   Patria Bookbinder, CMA   04/22/2024   After Visit Summary: (MyChart) Due to this being a telephonic visit, the after visit summary with patients personalized plan was offered to patient via MyChart   Notes: Nothing significant to report at this time.

## 2024-04-22 NOTE — Patient Instructions (Addendum)
 Meredith Holden , Thank you for taking time out of your busy schedule to complete your Annual Wellness Visit with me. I enjoyed our conversation and look forward to speaking with you again next year. I, as well as your care team,  appreciate your ongoing commitment to your health goals. Please review the following plan we discussed and let me know if I can assist you in the future. Your Game plan/ To Do List   Follow up Visits: Next Medicare AWV with our clinical staff: 04/25/2025  Have you seen your provider in the last 6 months (3 months if uncontrolled diabetes)? No Next Office Visit with your provider: 07/08/2024  Clinician Recommendations:  Aim for 30 minutes of exercise or brisk walking, 6-8 glasses of water, and 5 servings of fruits and vegetables each day. Educated and advised on getting the Tdap (Tetenus) and Shingles vaccines in 2025 at the local pharmacy.  Ordered a Hepatitis C Screening (Lab).        This is a list of the screening recommended for you and due dates:  Health Maintenance  Topic Date Due   Hepatitis C Screening  Never done   Zoster (Shingles) Vaccine (1 of 2) Never done   DTaP/Tdap/Td vaccine (2 - Td or Tdap) 09/25/2021   COVID-19 Vaccine (1 - 2024-25 season) Never done   Mammogram  04/05/2024   DEXA scan (bone density measurement)  04/22/2025*   Flu Shot  06/14/2024   Medicare Annual Wellness Visit  04/22/2025   Cologuard (Stool DNA test)  07/11/2025   Pneumonia Vaccine  Completed   HPV Vaccine  Aged Out   Meningitis B Vaccine  Aged Out   Colon Cancer Screening  Discontinued  *Topic was postponed. The date shown is not the original due date.    Advanced directives: (Copy Requested) Please bring a copy of your health care power of attorney and living will to the office to be added to your chart at your convenience. You can mail to [facility] [address] [place], [place] [address] or email to [email or web address] Advance Care Planning is  important because it:  [x]  Makes sure you receive the medical care that is consistent with your values, goals, and preferences  [x]  It provides guidance to your family and loved ones and reduces their decisional burden about whether or not they are making the right decisions based on your wishes.  Follow the link provided in your after visit summary or read over the paperwork we have mailed to you to help you started getting your Advance Directives in place. If you need assistance in completing these, please reach out to us  so that we can help you!

## 2024-06-08 ENCOUNTER — Other Ambulatory Visit: Payer: Self-pay | Admitting: Internal Medicine

## 2024-07-08 ENCOUNTER — Ambulatory Visit (INDEPENDENT_AMBULATORY_CARE_PROVIDER_SITE_OTHER): Admitting: Internal Medicine

## 2024-07-08 ENCOUNTER — Encounter: Payer: Self-pay | Admitting: Internal Medicine

## 2024-07-08 VITALS — BP 118/82 | HR 80 | Temp 97.9°F | Ht 60.0 in | Wt 114.8 lb

## 2024-07-08 DIAGNOSIS — E782 Mixed hyperlipidemia: Secondary | ICD-10-CM | POA: Diagnosis not present

## 2024-07-08 DIAGNOSIS — Z0000 Encounter for general adult medical examination without abnormal findings: Secondary | ICD-10-CM

## 2024-07-08 LAB — CBC
HCT: 44 % (ref 36.0–46.0)
Hemoglobin: 14.5 g/dL (ref 12.0–15.0)
MCHC: 33 g/dL (ref 30.0–36.0)
MCV: 94.4 fl (ref 78.0–100.0)
Platelets: 186 K/uL (ref 150.0–400.0)
RBC: 4.66 Mil/uL (ref 3.87–5.11)
RDW: 13.3 % (ref 11.5–15.5)
WBC: 6.6 K/uL (ref 4.0–10.5)

## 2024-07-08 LAB — COMPREHENSIVE METABOLIC PANEL WITH GFR
ALT: 13 U/L (ref 0–35)
AST: 18 U/L (ref 0–37)
Albumin: 4.5 g/dL (ref 3.5–5.2)
Alkaline Phosphatase: 77 U/L (ref 39–117)
BUN: 14 mg/dL (ref 6–23)
CO2: 29 meq/L (ref 19–32)
Calcium: 9.8 mg/dL (ref 8.4–10.5)
Chloride: 102 meq/L (ref 96–112)
Creatinine, Ser: 0.55 mg/dL (ref 0.40–1.20)
GFR: 90.08 mL/min (ref >60.00)
Glucose, Bld: 89 mg/dL (ref 70–99)
Potassium: 4.2 meq/L (ref 3.5–5.1)
Sodium: 141 meq/L (ref 135–145)
Total Bilirubin: 0.7 mg/dL (ref 0.2–1.2)
Total Protein: 7.3 g/dL (ref 6.0–8.3)

## 2024-07-08 LAB — LIPID PANEL
Cholesterol: 154 mg/dL (ref 0–200)
HDL: 64.4 mg/dL (ref >39.00)
LDL Cholesterol: 75 mg/dL (ref 0–99)
NonHDL: 89.82
Total CHOL/HDL Ratio: 2
Triglycerides: 74 mg/dL (ref 0.0–149.0)
VLDL: 14.8 mg/dL (ref 0.0–40.0)

## 2024-07-08 NOTE — Assessment & Plan Note (Signed)
 Flu shot yearly. Pneumonia complete. Shingrix complete. Tetanus up to date. Colonoscopy up to date. Mammogram counseled, pap smear aged out and dexa declines. Counseled about sun safety and mole surveillance. Counseled about the dangers of distracted driving. Given 10 year screening recommendations.

## 2024-07-08 NOTE — Assessment & Plan Note (Signed)
 Checking lipid panel and CMP and CBC. Adjust crestor  as needed.

## 2024-07-08 NOTE — Progress Notes (Signed)
   Subjective:   Patient ID: Meredith Holden, female    DOB: 31-Dec-1950, 73 y.o.   MRN: [id number]  The patient is here for physical. Pertinent topics discussed: Discussed the use of AI scribe software for clinical note transcription with the patient, who gave verbal consent to proceed.  History of Present Illness Meredith Holden is a 73 year old female who presents for a routine follow-up visit.  She experiences allergies affecting her eyes, characterized by runny and itchy eyes. Over-the-counter eye drops have provided some relief.  PMH, [facility], social history reviewed and updated  Review of Systems  Constitutional: Negative.   HENT: Negative.    Eyes: Negative.   Respiratory:  Negative for cough, chest tightness and shortness of breath.   Cardiovascular:  Negative for chest pain, palpitations and leg swelling.  Gastrointestinal:  Negative for abdominal distention, abdominal pain, constipation, diarrhea, nausea and vomiting.  Musculoskeletal: Negative.   Skin: Negative.   Neurological: Negative.   Psychiatric/Behavioral: Negative.      Objective:  Physical Exam Constitutional:      Appearance: She is well-developed.  HENT:     Head: Normocephalic and atraumatic.  Cardiovascular:     Rate and Rhythm: Normal rate and regular rhythm.  Pulmonary:     Effort: Pulmonary effort is normal. No respiratory distress.     Breath sounds: Normal breath sounds. No wheezing or rales.  Abdominal:     General: Bowel sounds are normal. There is no distension.     Palpations: Abdomen is soft.     Tenderness: There is no abdominal tenderness. There is no rebound.  Musculoskeletal:     Cervical back: Normal range of motion.  Skin:    General: Skin is warm and dry.  Neurological:     Mental Status: She is alert and oriented to person, place, and time.     Coordination: Coordination normal.     Vitals:   07/08/24 1302  BP: 118/82  Pulse: 80  Temp: 97.9 F (36.6 C)  TempSrc: Oral  SpO2: 95%   Weight: 114 lb 12.8 oz (52.1 kg)  Height: 5' (1.524 m)    Assessment & Plan:

## 2024-07-09 ENCOUNTER — Ambulatory Visit: Payer: Self-pay | Admitting: Internal Medicine

## 2024-09-08 ENCOUNTER — Other Ambulatory Visit: Payer: Self-pay | Admitting: Internal Medicine

## 2025-04-25 ENCOUNTER — Ambulatory Visit

## 2025-07-11 ENCOUNTER — Encounter: Admitting: Internal Medicine
